# Patient Record
Sex: Female | Born: 1980
Health system: Southern US, Community
[De-identification: ages and names within clinical notes are randomized; demographics above are authoritative.]

## PROBLEM LIST (undated history)

## (undated) DIAGNOSIS — Z803 Family history of malignant neoplasm of breast: Secondary | ICD-10-CM

## (undated) DIAGNOSIS — Z800 Family history of malignant neoplasm of digestive organs: Secondary | ICD-10-CM

## (undated) DIAGNOSIS — Z83719 Family history of colon polyps, unspecified: Secondary | ICD-10-CM

## (undated) DIAGNOSIS — G43909 Migraine, unspecified, not intractable, without status migrainosus: Secondary | ICD-10-CM

## (undated) DIAGNOSIS — Z8371 Family history of colonic polyps: Secondary | ICD-10-CM

## (undated) DIAGNOSIS — Z8481 Family history of carrier of genetic disease: Secondary | ICD-10-CM

## (undated) DIAGNOSIS — IMO0002 Reserved for concepts with insufficient information to code with codable children: Secondary | ICD-10-CM

## (undated) HISTORY — DX: Family history of colonic polyps: Z83.71

## (undated) HISTORY — DX: Family history of colon polyps, unspecified: Z83.719

## (undated) HISTORY — DX: Migraine, unspecified, not intractable, without status migrainosus: G43.909

## (undated) HISTORY — DX: Family history of malignant neoplasm of breast: Z80.3

## (undated) HISTORY — DX: Reserved for concepts with insufficient information to code with codable children: IMO0002

## (undated) HISTORY — DX: Family history of malignant neoplasm of digestive organs: Z80.0

## (undated) HISTORY — DX: Family history of carrier of genetic disease: Z84.81

---

## 1999-05-04 DIAGNOSIS — IMO0002 Reserved for concepts with insufficient information to code with codable children: Secondary | ICD-10-CM

## 1999-05-04 HISTORY — DX: Reserved for concepts with insufficient information to code with codable children: IMO0002

## 2006-04-20 ENCOUNTER — Ambulatory Visit: Payer: Self-pay | Admitting: Obstetrics and Gynecology

## 2006-08-11 ENCOUNTER — Inpatient Hospital Stay: Payer: Self-pay | Admitting: Certified Nurse Midwife

## 2007-04-14 ENCOUNTER — Ambulatory Visit: Payer: Self-pay

## 2007-04-16 ENCOUNTER — Ambulatory Visit: Payer: Self-pay | Admitting: Obstetrics and Gynecology

## 2007-04-19 ENCOUNTER — Ambulatory Visit: Payer: Self-pay | Admitting: Obstetrics and Gynecology

## 2007-04-21 ENCOUNTER — Ambulatory Visit: Payer: Self-pay | Admitting: Obstetrics and Gynecology

## 2007-05-02 ENCOUNTER — Ambulatory Visit: Payer: Self-pay | Admitting: Obstetrics and Gynecology

## 2007-12-15 ENCOUNTER — Inpatient Hospital Stay: Payer: Self-pay | Admitting: Obstetrics and Gynecology

## 2010-08-20 ENCOUNTER — Ambulatory Visit: Payer: Self-pay | Admitting: Internal Medicine

## 2010-12-23 ENCOUNTER — Ambulatory Visit: Payer: Self-pay | Admitting: General Practice

## 2012-01-24 LAB — HM PAP SMEAR: HM Pap smear: NORMAL

## 2012-07-01 HISTORY — PX: CYST REMOVAL TRUNK: SHX6283

## 2012-09-22 ENCOUNTER — Encounter: Payer: Self-pay | Admitting: Internal Medicine

## 2012-09-22 ENCOUNTER — Ambulatory Visit (INDEPENDENT_AMBULATORY_CARE_PROVIDER_SITE_OTHER): Payer: 59 | Admitting: Internal Medicine

## 2012-09-22 VITALS — BP 112/60 | HR 77 | Temp 98.2°F | Ht 68.25 in | Wt 256.0 lb

## 2012-09-22 DIAGNOSIS — Z0000 Encounter for general adult medical examination without abnormal findings: Secondary | ICD-10-CM

## 2012-09-22 DIAGNOSIS — T783XXA Angioneurotic edema, initial encounter: Secondary | ICD-10-CM

## 2012-09-22 DIAGNOSIS — G43909 Migraine, unspecified, not intractable, without status migrainosus: Secondary | ICD-10-CM | POA: Insufficient documentation

## 2012-09-22 MED ORDER — EPINEPHRINE 0.3 MG/0.3ML IJ SOAJ: 0.3000 mg | Freq: Once | INTRAMUSCULAR | Status: DC

## 2012-09-22 MED ORDER — SUMATRIPTAN SUCCINATE 100 MG PO TABS: 100.0000 mg | ORAL_TABLET | ORAL | Status: DC | PRN

## 2012-09-22 NOTE — Progress Notes (Signed)
Subjective:    Patient ID: Kara Vaughn, female    DOB: [birth year], 32 y.o.   MRN: [id number]  HPI 32 year old female presents to establish care. She reports a history of migraine headaches which are well controlled with intermittent use of Imitrex. Headaches are described as aching associated with photo and phonophobia. She only rarely uses medication for headache pain.  She is concerned today about nearly one-year history of intermittent episodes of swelling in her upper lip. She reports this occurs about once per month. She develops swelling in her right upper lip and right upper comes. She has not been able to identify a trigger. She has not had any trouble breathing or trouble swallowing. She has no previous history of allergies. She does not develop hives with these episodes. She denies use of any new medications or supplements. She denies any family history of angioedema. She brings a photo of herself during one of these episodes today which shows marked swelling of the right upper lip. In the past, a provider prescribed her with prednisone with some improvement in her symptoms. However, generally the swelling resolves on its own after a few days without intervention.  Outpatient Encounter Prescriptions as of 09/22/2012  Medication Sig Dispense Refill  . EPINEPHrine (EPIPEN) 0.3 mg/0.3 mL DEVI Inject 0.3 mLs (0.3 mg total) into the muscle once.  2 Device  1  . SUMAtriptan (IMITREX) 100 MG tablet Take 1 tablet (100 mg total) by mouth every 2 (two) hours as needed for migraine.  10 tablet  0   No facility-administered encounter medications on file as of 09/22/2012.   BP 112/60  Pulse 77  Temp(Src) 98.2 F (36.8 C) (Oral)  Ht 5' 8.25" (1.734 m)  Wt 256 lb (116.121 kg)  BMI 38.62 kg/m2  SpO2 98%  LMP 09/22/2012  Review of Systems  Constitutional: Negative for fever, chills, appetite change, fatigue and unexpected weight change.  HENT: Negative for ear pain, congestion, sore throat,  trouble swallowing, neck pain, voice change and sinus pressure.   Eyes: Negative for visual disturbance.  Respiratory: Negative for cough, shortness of breath, wheezing and stridor.   Cardiovascular: Negative for chest pain, palpitations and leg swelling.  Gastrointestinal: Negative for nausea, vomiting, abdominal pain, diarrhea, constipation, blood in stool, abdominal distention and anal bleeding.  Genitourinary: Negative for dysuria and flank pain.  Musculoskeletal: Negative for myalgias, arthralgias and gait problem.  Skin: Negative for color change and rash.  Neurological: Negative for dizziness and headaches.  Hematological: Negative for adenopathy. Does not bruise/bleed easily.  Psychiatric/Behavioral: Negative for suicidal ideas, sleep disturbance and dysphoric mood. The patient is not nervous/anxious.        Objective:   Physical Exam  Constitutional: She is oriented to person, place, and time. She appears well-developed and well-nourished. No distress.  HENT:  Head: Normocephalic and atraumatic.  Right Ear: External ear normal.  Left Ear: External ear normal.  Nose: Nose normal.  Mouth/Throat: Oropharynx is clear and moist. No oropharyngeal exudate.  Eyes: Conjunctivae are normal. Pupils are equal, round, and reactive to light. Right eye exhibits no discharge. Left eye exhibits no discharge. No scleral icterus.  Neck: Normal range of motion. Neck supple. No tracheal deviation present. No thyromegaly present.  Cardiovascular: Normal rate, regular rhythm, normal heart sounds and intact distal pulses.  Exam reveals no gallop and no friction rub.   No murmur heard. Pulmonary/Chest: Effort normal and breath sounds normal. No accessory muscle usage. Not tachypneic. No respiratory distress. She has no decreased  breath sounds. She has no wheezes. She has no rhonchi. She has no rales. She exhibits no tenderness.  Abdominal: Soft. Bowel sounds are normal. She exhibits no distension and no  mass. There is no tenderness. There is no rebound and no guarding.  Musculoskeletal: Normal range of motion. She exhibits no edema and no tenderness.  Lymphadenopathy:    She has no cervical adenopathy.  Neurological: She is alert and oriented to person, place, and time. No cranial nerve deficit. She exhibits normal muscle tone. Coordination normal.  Skin: Skin is warm and dry. No rash noted. She is not diaphoretic. No erythema. No pallor.  Psychiatric: She has a normal mood and affect. Her behavior is normal. Judgment and thought content normal.          Assessment & Plan:

## 2012-09-22 NOTE — Assessment & Plan Note (Signed)
Symptoms well controlled with intermittent use of Imitrex. Will continue.

## 2012-09-22 NOTE — Assessment & Plan Note (Signed)
Symptoms are most consistent with angioedema. Will set up referral to allergy immunology for further evaluation. In the interim, given that we do not know the trigger for her symptoms, will prescribe EpiPen for her to use if symptoms are progressive and associated with shortness of breath.

## 2012-09-28 ENCOUNTER — Other Ambulatory Visit (INDEPENDENT_AMBULATORY_CARE_PROVIDER_SITE_OTHER): Payer: 59

## 2012-09-28 ENCOUNTER — Telehealth: Payer: Self-pay | Admitting: Internal Medicine

## 2012-09-28 DIAGNOSIS — Z0000 Encounter for general adult medical examination without abnormal findings: Secondary | ICD-10-CM

## 2012-09-28 LAB — COMPREHENSIVE METABOLIC PANEL
ALT: 17 U/L (ref 0–35)
AST: 17 U/L (ref 0–37)
Albumin: 4.1 g/dL (ref 3.5–5.2)
Calcium: 8.6 mg/dL (ref 8.4–10.5)
Chloride: 103 mEq/L (ref 96–112)
Creatinine, Ser: 0.7 mg/dL (ref 0.4–1.2)
Potassium: 4 mEq/L (ref 3.5–5.1)

## 2012-09-28 LAB — CBC WITH DIFFERENTIAL/PLATELET
Eosinophils Relative: 2.4 % (ref 0.0–5.0)
HCT: 35 % — ABNORMAL LOW (ref 36.0–46.0)
Lymphocytes Relative: 22.1 % (ref 12.0–46.0)
Lymphs Abs: 1.8 10*3/uL (ref 0.7–4.0)
Monocytes Relative: 7.8 % (ref 3.0–12.0)
Platelets: 275 10*3/uL (ref 150.0–400.0)
WBC: 8 10*3/uL (ref 4.5–10.5)

## 2012-09-28 LAB — LIPID PANEL
HDL: 42.5 mg/dL (ref >39.00)
Total CHOL/HDL Ratio: 3

## 2012-09-28 NOTE — Telephone Encounter (Signed)
Yes, that will be fine.

## 2012-09-28 NOTE — Telephone Encounter (Signed)
Patient informed and appointment cancelled

## 2012-09-28 NOTE — Telephone Encounter (Signed)
Pt had to move her allergy test from 6/27 to 7/11 and she has a follow up appointment 10/31/12.  Do you want her to r/s her follow up after her allergy testing on 7/11

## 2012-10-31 ENCOUNTER — Ambulatory Visit (INDEPENDENT_AMBULATORY_CARE_PROVIDER_SITE_OTHER): Payer: 59 | Admitting: Internal Medicine

## 2012-10-31 ENCOUNTER — Encounter: Payer: Self-pay | Admitting: Internal Medicine

## 2012-10-31 VITALS — BP 118/80 | HR 72 | Temp 98.3°F | Wt 261.0 lb

## 2012-10-31 DIAGNOSIS — D509 Iron deficiency anemia, unspecified: Secondary | ICD-10-CM

## 2012-10-31 DIAGNOSIS — T7589XS Other specified effects of external causes, sequela: Secondary | ICD-10-CM

## 2012-10-31 DIAGNOSIS — T783XXS Angioneurotic edema, sequela: Secondary | ICD-10-CM

## 2012-10-31 DIAGNOSIS — T788XXS Other adverse effects, not elsewhere classified, sequela: Secondary | ICD-10-CM

## 2012-10-31 LAB — CBC WITH DIFFERENTIAL/PLATELET
Basophils Absolute: 0 10*3/uL (ref 0.0–0.1)
Basophils Relative: 0.4 % (ref 0.0–3.0)
Eosinophils Absolute: 0.2 10*3/uL (ref 0.0–0.7)
Lymphocytes Relative: 22.4 % (ref 12.0–46.0)
MCHC: 32.4 g/dL (ref 30.0–36.0)
MCV: 77.7 fl — ABNORMAL LOW (ref 78.0–100.0)
Monocytes Absolute: 0.5 10*3/uL (ref 0.1–1.0)
Neutro Abs: 5 10*3/uL (ref 1.4–7.7)
Neutrophils Relative %: 68.3 % (ref 43.0–77.0)
RDW: 21.7 % — ABNORMAL HIGH (ref 11.5–14.6)

## 2012-10-31 NOTE — Assessment & Plan Note (Signed)
No recurrent symptoms since last visit. Referral to allergy/immunology pending for 11/10/2012. Will follow.

## 2012-10-31 NOTE — Progress Notes (Signed)
Subjective:    Patient ID: Kara Vaughn, female    DOB: Oct 27, 1980, 32 y.o.   MRN: [id number]  HPI 31YO female with h/o angioedema, migraines and recent diagnosis iron def anemia presents for follow up. Doing well. Notes some nausea and constipation with iron supplement. Taking about 1-2 per day. Menses have sig decreased in flow with use of Mirena. No recurrent episodes of angioedema.  Outpatient Encounter Prescriptions as of 10/31/2012  Medication Sig Dispense Refill  . EPINEPHrine (EPIPEN) 0.3 mg/0.3 mL DEVI Inject 0.3 mLs (0.3 mg total) into the muscle once.  2 Device  1  . ferrous fumarate (HEMOCYTE - 106 MG FE) 325 (106 FE) MG TABS Take 1 tablet by mouth.      . Multiple Vitamin (MULTIVITAMIN) tablet Take 1 tablet by mouth daily.      . SUMAtriptan (IMITREX) 100 MG tablet Take 1 tablet (100 mg total) by mouth every 2 (two) hours as needed for migraine.  10 tablet  0   No facility-administered encounter medications on file as of 10/31/2012.   BP 118/80  Pulse 72  Temp(Src) 98.3 F (36.8 C) (Oral)  Wt 261 lb (118.389 kg)  BMI 39.37 kg/m2  SpO2 98%  Review of Systems  Constitutional: Negative for fever, chills, appetite change, fatigue and unexpected weight change.  HENT: Negative for ear pain, congestion, sore throat, trouble swallowing, neck pain, voice change and sinus pressure.   Eyes: Negative for visual disturbance.  Respiratory: Negative for cough, shortness of breath, wheezing and stridor.   Cardiovascular: Negative for chest pain, palpitations and leg swelling.  Gastrointestinal: Negative for nausea, vomiting, abdominal pain, diarrhea, constipation, blood in stool, abdominal distention and anal bleeding.  Genitourinary: Negative for dysuria and flank pain.  Musculoskeletal: Negative for myalgias, arthralgias and gait problem.  Skin: Negative for color change and rash.  Neurological: Negative for dizziness and headaches.  Hematological: Negative for adenopathy. Does not  bruise/bleed easily.  Psychiatric/Behavioral: Negative for suicidal ideas, sleep disturbance and dysphoric mood. The patient is not nervous/anxious.        Objective:   Physical Exam  Constitutional: She is oriented to person, place, and time. She appears well-developed and well-nourished. No distress.  HENT:  Head: Normocephalic and atraumatic.  Right Ear: External ear normal.  Left Ear: External ear normal.  Nose: Nose normal.  Mouth/Throat: Oropharynx is clear and moist. No oropharyngeal exudate.  Eyes: Conjunctivae are normal. Pupils are equal, round, and reactive to light. Right eye exhibits no discharge. Left eye exhibits no discharge. No scleral icterus.  Neck: Normal range of motion. Neck supple. No tracheal deviation present. No thyromegaly present.  Cardiovascular: Normal rate, regular rhythm, normal heart sounds and intact distal pulses.  Exam reveals no gallop and no friction rub.   No murmur heard. Pulmonary/Chest: Effort normal and breath sounds normal. No accessory muscle usage. Not tachypneic. No respiratory distress. She has no decreased breath sounds. She has no wheezes. She has no rhonchi. She has no rales. She exhibits no tenderness.  Musculoskeletal: Normal range of motion. She exhibits no edema and no tenderness.  Lymphadenopathy:    She has no cervical adenopathy.  Neurological: She is alert and oriented to person, place, and time. No cranial nerve deficit. She exhibits normal muscle tone. Coordination normal.  Skin: Skin is warm and dry. No rash noted. She is not diaphoretic. No erythema. No pallor.  Psychiatric: She has a normal mood and affect. Her behavior is normal. Judgment and thought content normal.  Assessment & Plan:

## 2012-10-31 NOTE — Assessment & Plan Note (Signed)
Microcytic anemia noted on recent labs. C/w iron def anemia secondary to menorrhagia. Menses have improved with Mirena. Pt has completed 1 month of iron supplementation. Having some issues with nausea and constipation on iron supplement. Will recheck CBC and ferritin today to see if any improvement.

## 2012-11-30 ENCOUNTER — Encounter: Payer: Self-pay | Admitting: Internal Medicine

## 2013-02-07 LAB — HM PAP SMEAR: HM Pap smear: NORMAL

## 2013-03-08 ENCOUNTER — Other Ambulatory Visit: Payer: Self-pay

## 2014-02-07 ENCOUNTER — Encounter: Payer: Self-pay | Admitting: Internal Medicine

## 2014-02-07 ENCOUNTER — Ambulatory Visit (INDEPENDENT_AMBULATORY_CARE_PROVIDER_SITE_OTHER): Payer: 59 | Admitting: Internal Medicine

## 2014-02-07 VITALS — BP 110/68 | HR 65 | Temp 98.2°F | Ht 68.0 in | Wt 273.5 lb

## 2014-02-07 DIAGNOSIS — Z0000 Encounter for general adult medical examination without abnormal findings: Secondary | ICD-10-CM | POA: Insufficient documentation

## 2014-02-07 DIAGNOSIS — T783XXA Angioneurotic edema, initial encounter: Secondary | ICD-10-CM

## 2014-02-07 LAB — COMPREHENSIVE METABOLIC PANEL
ALK PHOS: 51 U/L (ref 39–117)
ALT: 24 U/L (ref 0–35)
AST: 22 U/L (ref 0–37)
Albumin: 3.8 g/dL (ref 3.5–5.2)
BUN: 13 mg/dL (ref 6–23)
CO2: 28 meq/L (ref 19–32)
CREATININE: 0.6 mg/dL (ref 0.4–1.2)
Calcium: 8.8 mg/dL (ref 8.4–10.5)
Chloride: 102 mEq/L (ref 96–112)
GFR: 112.82 mL/min (ref >60.00)
GLUCOSE: 88 mg/dL (ref 70–99)
Potassium: 3.9 mEq/L (ref 3.5–5.1)
SODIUM: 136 meq/L (ref 135–145)
Total Bilirubin: 0.8 mg/dL (ref 0.2–1.2)
Total Protein: 7.2 g/dL (ref 6.0–8.3)

## 2014-02-07 LAB — CBC WITH DIFFERENTIAL/PLATELET
Basophils Absolute: 0 10*3/uL (ref 0.0–0.1)
Basophils Relative: 0.4 % (ref 0.0–3.0)
EOS PCT: 3 % (ref 0.0–5.0)
Eosinophils Absolute: 0.2 10*3/uL (ref 0.0–0.7)
HCT: 38.5 % (ref 36.0–46.0)
Hemoglobin: 12.7 g/dL (ref 12.0–15.0)
LYMPHS ABS: 2.1 10*3/uL (ref 0.7–4.0)
Lymphocytes Relative: 26.9 % (ref 12.0–46.0)
MCHC: 33 g/dL (ref 30.0–36.0)
MCV: 84.6 fl (ref 78.0–100.0)
MONO ABS: 0.6 10*3/uL (ref 0.1–1.0)
MONOS PCT: 7.8 % (ref 3.0–12.0)
Neutro Abs: 4.9 10*3/uL (ref 1.4–7.7)
Neutrophils Relative %: 61.9 % (ref 43.0–77.0)
PLATELETS: 240 10*3/uL (ref 150.0–400.0)
RBC: 4.55 Mil/uL (ref 3.87–5.11)
RDW: 14.5 % (ref 11.5–15.5)
WBC: 7.9 10*3/uL (ref 4.0–10.5)

## 2014-02-07 LAB — LIPID PANEL
CHOLESTEROL: 151 mg/dL (ref 0–200)
HDL: 40.9 mg/dL (ref >39.00)
LDL CALC: 93 mg/dL (ref 0–99)
NonHDL: 110.1
TRIGLYCERIDES: 84 mg/dL (ref 0.0–149.0)
Total CHOL/HDL Ratio: 4
VLDL: 16.8 mg/dL (ref 0.0–40.0)

## 2014-02-07 LAB — T4, FREE: Free T4: 0.85 ng/dL (ref 0.60–1.60)

## 2014-02-07 LAB — TSH: TSH: 1.72 u[IU]/mL (ref 0.35–4.50)

## 2014-02-07 LAB — HEMOGLOBIN A1C: HEMOGLOBIN A1C: 6.1 % (ref 4.6–6.5)

## 2014-02-07 LAB — VITAMIN D 25 HYDROXY (VIT D DEFICIENCY, FRACTURES): VITD: 27.39 ng/mL — ABNORMAL LOW (ref 30.00–100.00)

## 2014-02-07 LAB — MICROALBUMIN / CREATININE URINE RATIO
Creatinine,U: 79.7 mg/dL
MICROALB/CREAT RATIO: 0.8 mg/g (ref 0.0–30.0)
Microalb, Ur: 0.6 mg/dL (ref 0.0–1.9)

## 2014-02-07 MED ORDER — EPINEPHRINE 0.3 MG/0.3ML IJ SOAJ: 0.3000 mg | Freq: Once | INTRAMUSCULAR | Status: DC

## 2014-02-07 NOTE — Assessment & Plan Note (Signed)
Wt Readings from Last 3 Encounters:  02/07/14 273 lb 8 oz (124.059 kg)  10/31/12 261 lb (118.389 kg)  09/22/12 256 lb (116.121 kg)   Body mass index is 41.6 kg/(m^2). Encouraged healthy diet and limited calorie intake as well as regular physical activity, with goal of weight loss.

## 2014-02-07 NOTE — Progress Notes (Signed)
Pre visit review using our clinic review tool, if applicable. No additional management support is needed unless otherwise documented below in the visit note. 

## 2014-02-07 NOTE — Patient Instructions (Signed)

## 2014-02-07 NOTE — Assessment & Plan Note (Signed)
General medical exam normal today. PAP, pelvic and breast exam deferred,as completed by OB. Flu vaccine UTD. Labs today including CBC, CMP, lipids, TSH, A1c.

## 2014-02-07 NOTE — Progress Notes (Signed)
Subjective:    Patient ID: Milda Lindvall, female    DOB: [birth year], 33 y.o.   MRN: [id number]  HPI 33YO female presents for physical exam. Feeling well. No concerns today. Has exam scheduled with OB this month for PAP and breast exam. Trying to improve diet and increase physical activity with goal of weight loss.  Review of Systems  Constitutional: Negative for fever, chills, appetite change, fatigue and unexpected weight change.  Eyes: Negative for visual disturbance.  Respiratory: Negative for shortness of breath.   Cardiovascular: Negative for chest pain and leg swelling.  Gastrointestinal: Negative for nausea, vomiting, abdominal pain, diarrhea, constipation and blood in stool.  Genitourinary: Negative for menstrual problem.  Musculoskeletal: Positive for myalgias (occasional mild pain right lateral ankle, ongoing for years). Negative for arthralgias.  Skin: Negative for color change and rash.  Hematological: Negative for adenopathy. Does not bruise/bleed easily.  Psychiatric/Behavioral: Negative for sleep disturbance and dysphoric mood. The patient is not nervous/anxious.        Objective:    BP 110/68  Pulse 65  Temp(Src) 98.2 F (36.8 C) (Oral)  Ht 5\' 8"  (1.727 m)  Wt 273 lb 8 oz (124.059 kg)  BMI 41.60 kg/m2  SpO2 97%  LMP 01/03/2014 Physical Exam  Constitutional: She is oriented to person, place, and time. She appears well-developed and well-nourished. No distress.  HENT:  Head: Normocephalic and atraumatic.  Right Ear: External ear normal.  Left Ear: External ear normal.  Nose: Nose normal.  Mouth/Throat: Oropharynx is clear and moist. No oropharyngeal exudate.  Eyes: Conjunctivae and EOM are normal. Pupils are equal, round, and reactive to light. Right eye exhibits no discharge. Left eye exhibits no discharge. No scleral icterus.  Neck: Normal range of motion. Neck supple. No tracheal deviation present. No thyromegaly present.  Cardiovascular: Normal rate,  regular rhythm, normal heart sounds and intact distal pulses.  Exam reveals no gallop and no friction rub.   No murmur heard. Pulmonary/Chest: Effort normal and breath sounds normal. No accessory muscle usage. Not tachypneic. No respiratory distress. She has no decreased breath sounds. She has no wheezes. She has no rhonchi. She has no rales. She exhibits no tenderness.  Abdominal: Soft. Bowel sounds are normal. She exhibits no distension and no mass. There is no tenderness. There is no rebound and no guarding.  Musculoskeletal: Normal range of motion. She exhibits no edema and no tenderness.  Lymphadenopathy:    She has no cervical adenopathy.  Neurological: She is alert and oriented to person, place, and time. No cranial nerve deficit. She exhibits normal muscle tone. Coordination normal.  Skin: Skin is warm and dry. No rash noted. She is not diaphoretic. No erythema. No pallor.  Psychiatric: She has a normal mood and affect. Her behavior is normal. Judgment and thought content normal.          Assessment & Plan:   Problem List Items Addressed This Visit     Unprioritized   Angioedema of lips   Relevant Medications      EPINEPHrine 0.3 mg/0.3 mL IJ SOAJ injection   Routine general medical examination at a health care facility - Primary     General medical exam normal today. PAP, pelvic and breast exam deferred,as completed by OB. Flu vaccine UTD. Labs today including CBC, CMP, lipids, TSH, A1c.     Relevant Orders      T4, free      TSH      CBC with Differential  Comprehensive metabolic panel      Lipid panel      Vit D  25 hydroxy (rtn osteoporosis monitoring)      Microalbumin / creatinine urine ratio      Hemoglobin A1c   Severe obesity (BMI >= 40)      Wt Readings from Last 3 Encounters:  02/07/14 273 lb 8 oz (124.059 kg)  10/31/12 261 lb (118.389 kg)  09/22/12 256 lb (116.121 kg)   Body mass index is 41.6 kg/(m^2). Encouraged healthy diet and limited calorie  intake as well as regular physical activity, with goal of weight loss.        Return in about 1 year (around 02/08/2015) for Physical.

## 2014-03-30 LAB — HM PAP SMEAR: HM Pap smear: NORMAL

## 2014-05-23 ENCOUNTER — Encounter: Payer: Self-pay | Admitting: Internal Medicine

## 2014-06-18 ENCOUNTER — Other Ambulatory Visit: Payer: Self-pay | Admitting: Internal Medicine

## 2014-06-18 DIAGNOSIS — G43909 Migraine, unspecified, not intractable, without status migrainosus: Secondary | ICD-10-CM

## 2014-06-19 MED ORDER — SUMATRIPTAN SUCCINATE 100 MG PO TABS: 100.0000 mg | ORAL_TABLET | ORAL | Status: DC | PRN

## 2015-03-21 ENCOUNTER — Other Ambulatory Visit (HOSPITAL_COMMUNITY): Admission: RE | Admit: 2015-03-21 | Payer: 59 | Source: Ambulatory Visit | Admitting: Internal Medicine

## 2015-03-21 ENCOUNTER — Other Ambulatory Visit (HOSPITAL_COMMUNITY)
Admission: RE | Admit: 2015-03-21 | Discharge: 2015-03-21 | Disposition: A | Discharge status: Home / Self Care | Payer: 59 | Source: Ambulatory Visit | Attending: Internal Medicine | Admitting: Internal Medicine

## 2015-03-21 ENCOUNTER — Ambulatory Visit (INDEPENDENT_AMBULATORY_CARE_PROVIDER_SITE_OTHER): Payer: 59 | Admitting: Internal Medicine

## 2015-03-21 ENCOUNTER — Encounter: Payer: Self-pay | Admitting: Internal Medicine

## 2015-03-21 VITALS — BP 104/71 | HR 58 | Temp 98.9°F | Ht 68.5 in | Wt 232.0 lb

## 2015-03-21 DIAGNOSIS — Z139 Encounter for screening, unspecified: Secondary | ICD-10-CM | POA: Diagnosis present

## 2015-03-21 DIAGNOSIS — Z0000 Encounter for general adult medical examination without abnormal findings: Secondary | ICD-10-CM

## 2015-03-21 LAB — COMPREHENSIVE METABOLIC PANEL
ALT: 11 U/L (ref 0–35)
AST: 16 U/L (ref 0–37)
Albumin: 4.6 g/dL (ref 3.5–5.2)
Alkaline Phosphatase: 50 U/L (ref 39–117)
BILIRUBIN TOTAL: 1.2 mg/dL (ref 0.2–1.2)
BUN: 10 mg/dL (ref 6–23)
CO2: 29 meq/L (ref 19–32)
Calcium: 9.5 mg/dL (ref 8.4–10.5)
Chloride: 101 mEq/L (ref 96–112)
Creatinine, Ser: 0.69 mg/dL (ref 0.40–1.20)
GFR: 102.77 mL/min (ref >60.00)
GLUCOSE: 89 mg/dL (ref 70–99)
POTASSIUM: 4.1 meq/L (ref 3.5–5.1)
SODIUM: 138 meq/L (ref 135–145)
TOTAL PROTEIN: 7.1 g/dL (ref 6.0–8.3)

## 2015-03-21 LAB — CBC WITH DIFFERENTIAL/PLATELET
BASOS ABS: 0 10*3/uL (ref 0.0–0.1)
Basophils Relative: 0.8 % (ref 0.0–3.0)
Eosinophils Absolute: 0.1 10*3/uL (ref 0.0–0.7)
Eosinophils Relative: 2.3 % (ref 0.0–5.0)
HCT: 43.6 % (ref 36.0–46.0)
Hemoglobin: 14.6 g/dL (ref 12.0–15.0)
LYMPHS ABS: 1.7 10*3/uL (ref 0.7–4.0)
Lymphocytes Relative: 28.7 % (ref 12.0–46.0)
MCHC: 33.4 g/dL (ref 30.0–36.0)
MCV: 88.2 fl (ref 78.0–100.0)
MONO ABS: 0.4 10*3/uL (ref 0.1–1.0)
MONOS PCT: 6.8 % (ref 3.0–12.0)
NEUTROS ABS: 3.6 10*3/uL (ref 1.4–7.7)
NEUTROS PCT: 61.4 % (ref 43.0–77.0)
PLATELETS: 244 10*3/uL (ref 150.0–400.0)
RBC: 4.95 Mil/uL (ref 3.87–5.11)
RDW: 13.9 % (ref 11.5–15.5)
WBC: 5.9 10*3/uL (ref 4.0–10.5)

## 2015-03-21 LAB — LIPID PANEL
CHOL/HDL RATIO: 2
Cholesterol: 124 mg/dL (ref 0–200)
HDL: 52.2 mg/dL (ref >39.00)
LDL Cholesterol: 62 mg/dL (ref 0–99)
NONHDL: 71.38
Triglycerides: 48 mg/dL (ref 0.0–149.0)
VLDL: 9.6 mg/dL (ref 0.0–40.0)

## 2015-03-21 LAB — TSH: TSH: 0.91 u[IU]/mL (ref 0.35–4.50)

## 2015-03-21 LAB — VITAMIN D 25 HYDROXY (VIT D DEFICIENCY, FRACTURES): VITD: 26.3 ng/mL — ABNORMAL LOW (ref 30.00–100.00)

## 2015-03-21 LAB — HEMOGLOBIN A1C: HEMOGLOBIN A1C: 5.7 % (ref 4.6–6.5)

## 2015-03-21 NOTE — Progress Notes (Signed)
Pre visit review using our clinic review tool, if applicable. No additional management support is needed unless otherwise documented below in the visit note. 

## 2015-03-21 NOTE — Addendum Note (Signed)
Addended by: Karlene Einstein D on: 03/21/2015 11:12 AM   Modules accepted: Orders

## 2015-03-21 NOTE — Progress Notes (Signed)
Subjective:    Patient ID: Kara Vaughn, female    DOB: 1980-11-12, 34 y.o.   MRN: [id number]  HPI  34YO female presents for physical exam.  Feeling well. Trying to be more active. Walking more. Limiting intake of sweetened beverages.  Limiting overall calories.  Wt Readings from Last 3 Encounters:  03/21/15 232 lb (105.235 kg)  02/07/14 273 lb 8 oz (124.059 kg)  10/31/12 261 lb (118.389 kg)   BP Readings from Last 3 Encounters:  03/21/15 104/71  02/07/14 110/68  10/31/12 118/80    Past Medical History  Diagnosis Date  . Migraine   . HPV test positive 2001   Family History  Problem Relation Age of Onset  . Thyroid disease Father   . Heart disease Father     2011, MI  . Thyroid disease Sister   . Cancer Maternal Aunt     ?colon cancer  . Cancer Paternal Aunt     esophageal  . COPD Maternal Grandfather   . Cancer Paternal Grandmother     ?uterine cancer, breast cancer, bone cancer   Past Surgical History  Procedure Laterality Date  . Cyst removal trunk  07/2012    [facility]  . Vaginal delivery      3   Social History   Social History  . Marital Status: Married    Spouse Name: N/A  . Number of Children: N/A  . Years of Education: N/A   Social History Main Topics  . Smoking status: Never Smoker   . Smokeless tobacco: Never Used  . Alcohol Use: Yes     Comment: Rarely  . Drug Use: No  . Sexual Activity: Not Asked   Other Topics Concern  . None   Social History Narrative   Lives in [place], [place]. 3 children 47, 47, and 14 years old. Fluent in [place].      Work - [employer] in [employer], sports, [employer]             Review of Systems  Constitutional: Negative for fever, chills, appetite change, fatigue and unexpected weight change.  Eyes: Negative for visual disturbance.  Respiratory: Negative for shortness of breath and wheezing.   Cardiovascular: Negative for chest pain and leg swelling.  Gastrointestinal: Negative for nausea, vomiting,  abdominal pain, diarrhea and constipation.  Musculoskeletal: Negative for myalgias and arthralgias.  Skin: Negative for color change and rash.  Hematological: Negative for adenopathy. Does not bruise/bleed easily.  Psychiatric/Behavioral: Negative for suicidal ideas, sleep disturbance and dysphoric mood. The patient is not nervous/anxious.        Objective:    BP 104/71 mmHg  Pulse 58  Temp(Src) 98.9 F (37.2 C) (Oral)  Ht 5' 8.5" (1.74 m)  Wt 232 lb (105.235 kg)  BMI 34.76 kg/m2  SpO2 99%  LMP 03/16/2015 Physical Exam  Constitutional: She is oriented to person, place, and time. She appears well-developed and well-nourished. No distress.  HENT:  Head: Normocephalic and atraumatic.  Right Ear: External ear normal.  Left Ear: External ear normal.  Nose: Nose normal.  Mouth/Throat: Oropharynx is clear and moist. No oropharyngeal exudate.  Eyes: Conjunctivae are normal. Pupils are equal, round, and reactive to light. Right eye exhibits no discharge. Left eye exhibits no discharge. No scleral icterus.  Neck: Normal range of motion. Neck supple. No tracheal deviation present. No thyromegaly present.  Cardiovascular: Normal rate, regular rhythm, normal heart sounds and intact distal pulses.  Exam reveals no gallop and no friction rub.  No murmur heard. Pulmonary/Chest: Effort normal and breath sounds normal. No respiratory distress. She has no wheezes. She has no rales. She exhibits no tenderness.  Abdominal: Soft. Bowel sounds are normal. She exhibits no distension and no mass. There is no tenderness. There is no rebound and no guarding.  Genitourinary: Rectum normal, vagina normal and uterus normal. No breast swelling, tenderness, discharge or bleeding. Pelvic exam was performed with patient supine. There is no rash, tenderness or lesion on the right labia. There is no rash, tenderness or lesion on the left labia. Uterus is not enlarged and not tender. Cervix exhibits no motion  tenderness, no discharge and no friability. Right adnexum displays no mass, no tenderness and no fullness. Left adnexum displays no mass, no tenderness and no fullness. No erythema or tenderness in the vagina. No vaginal discharge found.  Musculoskeletal: Normal range of motion. She exhibits no edema or tenderness.  Lymphadenopathy:    She has no cervical adenopathy.  Neurological: She is alert and oriented to person, place, and time. No cranial nerve deficit. She exhibits normal muscle tone. Coordination normal.  Skin: Skin is warm and dry. No rash noted. She is not diaphoretic. No erythema. No pallor.  Psychiatric: She has a normal mood and affect. Her behavior is normal. Judgment and thought content normal.          Assessment & Plan:   Problem List Items Addressed This Visit      Unprioritized   Routine general medical examination at a health care facility - Primary   Relevant Orders   TSH   CBC with Differential/Platelet   Comprehensive metabolic panel   Lipid panel   Hemoglobin A1c   VITAMIN D 25 Hydroxy (Vit-D Deficiency, Fractures)       No Follow-up on file.

## 2015-03-21 NOTE — Assessment & Plan Note (Signed)
General medical exam including breast exam and pelvic exam normal today. PAP pending. Encouraged healthy diet and exercise. Immunizations UTD. Labs today.

## 2015-03-21 NOTE — Patient Instructions (Signed)
Health Maintenance, Female Adopting a healthy lifestyle and getting preventive care can go a long way to promote health and wellness. Talk with your health care provider about what schedule of regular examinations is right for you. This is a good chance for you to check in with your provider about disease prevention and staying healthy. In between checkups, there are plenty of things you can do on your own. Experts have done a lot of research about which lifestyle changes and preventive measures are most likely to keep you healthy. Ask your health care provider for more information. WEIGHT AND DIET  Eat a healthy diet  Be sure to include plenty of vegetables, fruits, low-fat dairy products, and lean protein.  Do not eat a lot of foods high in solid fats, added sugars, or salt.  Get regular exercise. This is one of the most important things you can do for your health.  Most adults should exercise for at least 150 minutes each week. The exercise should increase your heart rate and make you sweat (moderate-intensity exercise).  Most adults should also do strengthening exercises at least twice a week. This is in addition to the moderate-intensity exercise.  Maintain a healthy weight  Body mass index (BMI) is a measurement that can be used to identify possible weight problems. It estimates body fat based on height and weight. Your health care provider can help determine your BMI and help you achieve or maintain a healthy weight.  For females 20 years of age and older:   A BMI below 18.5 is considered underweight.  A BMI of 18.5 to 24.9 is normal.  A BMI of 25 to 29.9 is considered overweight.  A BMI of 30 and above is considered obese.  Watch levels of cholesterol and blood lipids  You should start having your blood tested for lipids and cholesterol at 35 years of age, then have this test every 5 years.  You may need to have your cholesterol levels checked more often if:  Your lipid  or cholesterol levels are high.  You are older than 35 years of age.  You are at high risk for heart disease.  CANCER SCREENING   Lung Cancer  Lung cancer screening is recommended for adults 55-80 years old who are at high risk for lung cancer because of a history of smoking.  A yearly low-dose CT scan of the lungs is recommended for people who:  Currently smoke.  Have quit within the past 15 years.  Have at least a 30-pack-year history of smoking. A pack year is smoking an average of one pack of cigarettes a day for 1 year.  Yearly screening should continue until it has been 15 years since you quit.  Yearly screening should stop if you develop a health problem that would prevent you from having lung cancer treatment.  Breast Cancer  Practice breast self-awareness. This means understanding how your breasts normally appear and feel.  It also means doing regular breast self-exams. Let your health care provider know about any changes, no matter how small.  If you are in your 20s or 30s, you should have a clinical breast exam (CBE) by a health care provider every 1-3 years as part of a regular health exam.  If you are 40 or older, have a CBE every year. Also consider having a breast X-ray (mammogram) every year.  If you have a family history of breast cancer, talk to your health care provider about genetic screening.  If you   are at high risk for breast cancer, talk to your health care provider about having an MRI and a mammogram every year.  Breast cancer gene (BRCA) assessment is recommended for women who have family members with BRCA-related cancers. BRCA-related cancers include:  Breast.  Ovarian.  Tubal.  Peritoneal cancers.  Results of the assessment will determine the need for genetic counseling and BRCA1 and BRCA2 testing. Cervical Cancer Your health care provider may recommend that you be screened regularly for cancer of the pelvic organs (ovaries, uterus, and  vagina). This screening involves a pelvic examination, including checking for microscopic changes to the surface of your cervix (Pap test). You may be encouraged to have this screening done every 3 years, beginning at age 21.  For women ages 30-65, health care providers may recommend pelvic exams and Pap testing every 3 years, or they may recommend the Pap and pelvic exam, combined with testing for human papilloma virus (HPV), every 5 years. Some types of HPV increase your risk of cervical cancer. Testing for HPV may also be done on women of any age with unclear Pap test results.  Other health care providers may not recommend any screening for nonpregnant women who are considered low risk for pelvic cancer and who do not have symptoms. Ask your health care provider if a screening pelvic exam is right for you.  If you have had past treatment for cervical cancer or a condition that could lead to cancer, you need Pap tests and screening for cancer for at least 20 years after your treatment. If Pap tests have been discontinued, your risk factors (such as having a new sexual partner) need to be reassessed to determine if screening should resume. Some women have medical problems that increase the chance of getting cervical cancer. In these cases, your health care provider may recommend more frequent screening and Pap tests. Colorectal Cancer  This type of cancer can be detected and often prevented.  Routine colorectal cancer screening usually begins at 35 years of age and continues through 35 years of age.  Your health care provider may recommend screening at an earlier age if you have risk factors for colon cancer.  Your health care provider may also recommend using home test kits to check for hidden blood in the stool.  A small camera at the end of a tube can be used to examine your colon directly (sigmoidoscopy or colonoscopy). This is done to check for the earliest forms of colorectal  cancer.  Routine screening usually begins at age 50.  Direct examination of the colon should be repeated every 5-10 years through 35 years of age. However, you may need to be screened more often if early forms of precancerous polyps or small growths are found. Skin Cancer  Check your skin from head to toe regularly.  Tell your health care provider about any new moles or changes in moles, especially if there is a change in a mole's shape or color.  Also tell your health care provider if you have a mole that is larger than the size of a pencil eraser.  Always use sunscreen. Apply sunscreen liberally and repeatedly throughout the day.  Protect yourself by wearing long sleeves, pants, a wide-brimmed hat, and sunglasses whenever you are outside. HEART DISEASE, DIABETES, AND HIGH BLOOD PRESSURE   High blood pressure causes heart disease and increases the risk of stroke. High blood pressure is more likely to develop in:  People who have blood pressure in the high end   of the normal range (130-139/85-89 mm Hg).  People who are overweight or obese.  People who are African American.  If you are 38-23 years of age, have your blood pressure checked every 3-5 years. If you are 61 years of age or older, have your blood pressure checked every year. You should have your blood pressure measured twice--once when you are at a hospital or clinic, and once when you are not at a hospital or clinic. Record the average of the two measurements. To check your blood pressure when you are not at a hospital or clinic, you can use:  An automated blood pressure machine at a pharmacy.  A home blood pressure monitor.  If you are between 45 years and 39 years old, ask your health care provider if you should take aspirin to prevent strokes.  Have regular diabetes screenings. This involves taking a blood sample to check your fasting blood sugar level.  If you are at a normal weight and have a low risk for diabetes,  have this test once every three years after 35 years of age.  If you are overweight and have a high risk for diabetes, consider being tested at a younger age or more often. PREVENTING INFECTION  Hepatitis B  If you have a higher risk for hepatitis B, you should be screened for this virus. You are considered at high risk for hepatitis B if:  You were born in a country where hepatitis B is common. Ask your health care provider which countries are considered high risk.  Your parents were born in a high-risk country, and you have not been immunized against hepatitis B (hepatitis B vaccine).  You have HIV or AIDS.  You use needles to inject street drugs.  You live with someone who has hepatitis B.  You have had sex with someone who has hepatitis B.  You get hemodialysis treatment.  You take certain medicines for conditions, including cancer, organ transplantation, and autoimmune conditions. Hepatitis C  Blood testing is recommended for:  Everyone born from [birth year] through 1966.  Anyone with known risk factors for hepatitis C. Sexually transmitted infections (STIs)  You should be screened for sexually transmitted infections (STIs) including gonorrhea and chlamydia if:  You are sexually active and are younger than 35 years of age.  You are older than 35 years of age and your health care provider tells you that you are at risk for this type of infection.  Your sexual activity has changed since you were last screened and you are at an increased risk for chlamydia or gonorrhea. Ask your health care provider if you are at risk.  If you do not have HIV, but are at risk, it may be recommended that you take a prescription medicine daily to prevent HIV infection. This is called pre-exposure prophylaxis (PrEP). You are considered at risk if:  You are sexually active and do not regularly use condoms or know the HIV status of your partner(s).  You take drugs by injection.  You are sexually  active with a partner who has HIV. Talk with your health care provider about whether you are at high risk of being infected with HIV. If you choose to begin PrEP, you should first be tested for HIV. You should then be tested every 3 months for as long as you are taking PrEP.  PREGNANCY   If you are premenopausal and you may become pregnant, ask your health care provider about preconception counseling.  If you may  become pregnant, take 400 to 800 micrograms (mcg) of folic acid every day.  If you want to prevent pregnancy, talk to your health care provider about birth control (contraception). OSTEOPOROSIS AND MENOPAUSE   Osteoporosis is a disease in which the bones lose minerals and strength with aging. This can result in serious bone fractures. Your risk for osteoporosis can be identified using a bone density scan.  If you are 61 years of age or older, or if you are at risk for osteoporosis and fractures, ask your health care provider if you should be screened.  Ask your health care provider whether you should take a calcium or vitamin D supplement to lower your risk for osteoporosis.  Menopause may have certain physical symptoms and risks.  Hormone replacement therapy may reduce some of these symptoms and risks. Talk to your health care provider about whether hormone replacement therapy is right for you.  HOME CARE INSTRUCTIONS   Schedule regular health, dental, and eye exams.  Stay current with your immunizations.   Do not use any tobacco products including cigarettes, chewing tobacco, or electronic cigarettes.  If you are pregnant, do not drink alcohol.  If you are breastfeeding, limit how much and how often you drink alcohol.  Limit alcohol intake to no more than 1 drink per day for nonpregnant women. One drink equals 12 ounces of beer, 5 ounces of wine, or 1 ounces of hard liquor.  Do not use street drugs.  Do not share needles.  Ask your health care provider for help if  you need support or information about quitting drugs.  Tell your health care provider if you often feel depressed.  Tell your health care provider if you have ever been abused or do not feel safe at home.   This information is not intended to replace advice given to you by your health care provider. Make sure you discuss any questions you have with your health care provider.   Document Released: 11/02/2010 Document Revised: 05/10/2014 Document Reviewed: 03/21/2013 Elsevier Interactive Patient Education [employer].

## 2015-03-25 LAB — CYTOLOGY - PAP

## 2015-07-06 ENCOUNTER — Other Ambulatory Visit: Payer: Self-pay | Admitting: Internal Medicine

## 2016-06-28 ENCOUNTER — Encounter: Payer: 59 | Admitting: Family

## 2016-07-07 ENCOUNTER — Other Ambulatory Visit (HOSPITAL_COMMUNITY)
Admission: RE | Admit: 2016-07-07 | Discharge: 2016-07-07 | Disposition: A | Discharge status: Home / Self Care | Payer: 59 | Source: Ambulatory Visit | Attending: Family | Admitting: Family

## 2016-07-07 ENCOUNTER — Encounter: Payer: Self-pay | Admitting: Family

## 2016-07-07 ENCOUNTER — Ambulatory Visit (INDEPENDENT_AMBULATORY_CARE_PROVIDER_SITE_OTHER): Payer: 59 | Admitting: Family

## 2016-07-07 VITALS — BP 120/70 | HR 58 | Temp 98.1°F | Ht 69.0 in | Wt 257.2 lb

## 2016-07-07 DIAGNOSIS — Z1151 Encounter for screening for human papillomavirus (HPV): Secondary | ICD-10-CM | POA: Insufficient documentation

## 2016-07-07 DIAGNOSIS — Z01419 Encounter for gynecological examination (general) (routine) without abnormal findings: Secondary | ICD-10-CM | POA: Diagnosis not present

## 2016-07-07 DIAGNOSIS — T783XXA Angioneurotic edema, initial encounter: Secondary | ICD-10-CM | POA: Diagnosis not present

## 2016-07-07 DIAGNOSIS — Z0000 Encounter for general adult medical examination without abnormal findings: Secondary | ICD-10-CM | POA: Diagnosis not present

## 2016-07-07 DIAGNOSIS — G43009 Migraine without aura, not intractable, without status migrainosus: Secondary | ICD-10-CM | POA: Diagnosis not present

## 2016-07-07 MED ORDER — SUMATRIPTAN SUCCINATE 100 MG PO TABS: ORAL_TABLET | ORAL | 0 refills | Status: DC

## 2016-07-07 NOTE — Assessment & Plan Note (Signed)
No further episodes. Politely declines refill epinephrine pen.

## 2016-07-07 NOTE — Patient Instructions (Addendum)
Pleasure meeting you Please follow up for annual skin check with derm as we discussed.    Health Maintenance, Female Adopting a healthy lifestyle and getting preventive care can go a long way to promote health and wellness. Talk with your health care provider about what schedule of regular examinations is right for you. This is a good chance for you to check in with your provider about disease prevention and staying healthy. In between checkups, there are plenty of things you can do on your own. Experts have done a lot of research about which lifestyle changes and preventive measures are most likely to keep you healthy. Ask your health care provider for more information. Weight and diet Eat a healthy diet  Be sure to include plenty of vegetables, fruits, low-fat dairy products, and lean protein.  Do not eat a lot of foods high in solid fats, added sugars, or salt.  Get regular exercise. This is one of the most important things you can do for your health.  Most adults should exercise for at least 150 minutes each week. The exercise should increase your heart rate and make you sweat (moderate-intensity exercise).  Most adults should also do strengthening exercises at least twice a week. This is in addition to the moderate-intensity exercise. Maintain a healthy weight  Body mass index (BMI) is a measurement that can be used to identify possible weight problems. It estimates body fat based on height and weight. Your health care provider can help determine your BMI and help you achieve or maintain a healthy weight.  For females 75 years of age and older:  A BMI below 18.5 is considered underweight.  A BMI of 18.5 to 24.9 is normal.  A BMI of 25 to 29.9 is considered overweight.  A BMI of 30 and above is considered obese. Watch levels of cholesterol and blood lipids  You should start having your blood tested for lipids and cholesterol at 37 years of age, then have this test every 5  years.  You may need to have your cholesterol levels checked more often if:  Your lipid or cholesterol levels are high.  You are older than 37 years of age.  You are at high risk for heart disease. Cancer screening Lung Cancer  Lung cancer screening is recommended for adults 33-24 years old who are at high risk for lung cancer because of a history of smoking.  A yearly low-dose CT scan of the lungs is recommended for people who:  Currently smoke.  Have quit within the past 15 years.  Have at least a 30-pack-year history of smoking. A pack year is smoking an average of one pack of cigarettes a day for 1 year.  Yearly screening should continue until it has been 15 years since you quit.  Yearly screening should stop if you develop a health problem that would prevent you from having lung cancer treatment. Breast Cancer  Practice breast self-awareness. This means understanding how your breasts normally appear and feel.  It also means doing regular breast self-exams. Let your health care provider know about any changes, no matter how small.  If you are in your 20s or 30s, you should have a clinical breast exam (CBE) by a health care provider every 1-3 years as part of a regular health exam.  If you are 71 or older, have a CBE every year. Also consider having a breast X-ray (mammogram) every year.  If you have a family history of breast cancer, talk to  your health care provider about genetic screening.  If you are at high risk for breast cancer, talk to your health care provider about having an MRI and a mammogram every year.  Breast cancer gene (BRCA) assessment is recommended for women who have family members with BRCA-related cancers. BRCA-related cancers include:  Breast.  Ovarian.  Tubal.  Peritoneal cancers.  Results of the assessment will determine the need for genetic counseling and BRCA1 and BRCA2 testing. Cervical Cancer  Your health care provider may recommend  that you be screened regularly for cancer of the pelvic organs (ovaries, uterus, and vagina). This screening involves a pelvic examination, including checking for microscopic changes to the surface of your cervix (Pap test). You may be encouraged to have this screening done every 3 years, beginning at age 68.  For women ages 47-65, health care providers may recommend pelvic exams and Pap testing every 3 years, or they may recommend the Pap and pelvic exam, combined with testing for human papilloma virus (HPV), every 5 years. Some types of HPV increase your risk of cervical cancer. Testing for HPV may also be done on women of any age with unclear Pap test results.  Other health care providers may not recommend any screening for nonpregnant women who are considered low risk for pelvic cancer and who do not have symptoms. Ask your health care provider if a screening pelvic exam is right for you.  If you have had past treatment for cervical cancer or a condition that could lead to cancer, you need Pap tests and screening for cancer for at least 20 years after your treatment. If Pap tests have been discontinued, your risk factors (such as having a new sexual partner) need to be reassessed to determine if screening should resume. Some women have medical problems that increase the chance of getting cervical cancer. In these cases, your health care provider may recommend more frequent screening and Pap tests. Colorectal Cancer  This type of cancer can be detected and often prevented.  Routine colorectal cancer screening usually begins at 37 years of age and continues through 38 years of age.  Your health care provider may recommend screening at an earlier age if you have risk factors for colon cancer.  Your health care provider may also recommend using home test kits to check for hidden blood in the stool.  A small camera at the end of a tube can be used to examine your colon directly (sigmoidoscopy or  colonoscopy). This is done to check for the earliest forms of colorectal cancer.  Routine screening usually begins at age 24.  Direct examination of the colon should be repeated every 5-10 years through 37 years of age. However, you may need to be screened more often if early forms of precancerous polyps or small growths are found. Skin Cancer  Check your skin from head to toe regularly.  Tell your health care provider about any new moles or changes in moles, especially if there is a change in a mole's shape or color.  Also tell your health care provider if you have a mole that is larger than the size of a pencil eraser.  Always use sunscreen. Apply sunscreen liberally and repeatedly throughout the day.  Protect yourself by wearing long sleeves, pants, a wide-brimmed hat, and sunglasses whenever you are outside. Heart disease, diabetes, and high blood pressure  High blood pressure causes heart disease and increases the risk of stroke. High blood pressure is more likely to develop in:  People who have blood pressure in the high end of the normal range (130-139/85-89 mm Hg).  People who are overweight or obese.  People who are African American.  If you are 60-50 years of age, have your blood pressure checked every 3-5 years. If you are 33 years of age or older, have your blood pressure checked every year. You should have your blood pressure measured twice-once when you are at a hospital or clinic, and once when you are not at a hospital or clinic. Record the average of the two measurements. To check your blood pressure when you are not at a hospital or clinic, you can use:  An automated blood pressure machine at a pharmacy.  A home blood pressure monitor.  If you are between 27 years and 42 years old, ask your health care provider if you should take aspirin to prevent strokes.  Have regular diabetes screenings. This involves taking a blood sample to check your fasting blood sugar  level.  If you are at a normal weight and have a low risk for diabetes, have this test once every three years after 37 years of age.  If you are overweight and have a high risk for diabetes, consider being tested at a younger age or more often. Preventing infection Hepatitis B  If you have a higher risk for hepatitis B, you should be screened for this virus. You are considered at high risk for hepatitis B if:  You were born in a country where hepatitis B is common. Ask your health care provider which countries are considered high risk.  Your parents were born in a high-risk country, and you have not been immunized against hepatitis B (hepatitis B vaccine).  You have HIV or AIDS.  You use needles to inject street drugs.  You live with someone who has hepatitis B.  You have had sex with someone who has hepatitis B.  You get hemodialysis treatment.  You take certain medicines for conditions, including cancer, organ transplantation, and autoimmune conditions. Hepatitis C  Blood testing is recommended for:  Everyone born from [birth year] through 1966.  Anyone with known risk factors for hepatitis C. Sexually transmitted infections (STIs)  You should be screened for sexually transmitted infections (STIs) including gonorrhea and chlamydia if:  You are sexually active and are younger than 37 years of age.  You are older than 37 years of age and your health care provider tells you that you are at risk for this type of infection.  Your sexual activity has changed since you were last screened and you are at an increased risk for chlamydia or gonorrhea. Ask your health care provider if you are at risk.  If you do not have HIV, but are at risk, it may be recommended that you take a prescription medicine daily to prevent HIV infection. This is called pre-exposure prophylaxis (PrEP). You are considered at risk if:  You are sexually active and do not regularly use condoms or know the HIV status  of your partner(s).  You take drugs by injection.  You are sexually active with a partner who has HIV. Talk with your health care provider about whether you are at high risk of being infected with HIV. If you choose to begin PrEP, you should first be tested for HIV. You should then be tested every 3 months for as long as you are taking PrEP. Pregnancy  If you are premenopausal and you may become pregnant, ask your health care provider about  preconception counseling.  If you may become pregnant, take 400 to 800 micrograms (mcg) of folic acid every day.  If you want to prevent pregnancy, talk to your health care provider about birth control (contraception). Osteoporosis and menopause  Osteoporosis is a disease in which the bones lose minerals and strength with aging. This can result in serious bone fractures. Your risk for osteoporosis can be identified using a bone density scan.  If you are 54 years of age or older, or if you are at risk for osteoporosis and fractures, ask your health care provider if you should be screened.  Ask your health care provider whether you should take a calcium or vitamin D supplement to lower your risk for osteoporosis.  Menopause may have certain physical symptoms and risks.  Hormone replacement therapy may reduce some of these symptoms and risks. Talk to your health care provider about whether hormone replacement therapy is right for you. Follow these instructions at home:  Schedule regular health, dental, and eye exams.  Stay current with your immunizations.  Do not use any tobacco products including cigarettes, chewing tobacco, or electronic cigarettes.  If you are pregnant, do not drink alcohol.  If you are breastfeeding, limit how much and how often you drink alcohol.  Limit alcohol intake to no more than 1 drink per day for nonpregnant women. One drink equals 12 ounces of beer, 5 ounces of wine, or 1 ounces of hard liquor.  Do not use street  drugs.  Do not share needles.  Ask your health care provider for help if you need support or information about quitting drugs.  Tell your health care provider if you often feel depressed.  Tell your health care provider if you have ever been abused or do not feel safe at home. This information is not intended to replace advice given to you by your health care provider. Make sure you discuss any questions you have with your health care provider. Document Released: 11/02/2010 Document Revised: 09/25/2015 Document Reviewed: 01/21/2015 Elsevier Interactive Patient Education  2017 [employer].

## 2016-07-07 NOTE — Progress Notes (Signed)
Subjective:    Patient ID: Kara Vaughn, female    DOB: [birth year], 36 y.o.   MRN: [id number]  CC: Kara Vaughn is a 35 y.o. female who presents today for physical exam.    HPI: HA- No 'bad HA in long time', in over a year. Describes triggers as perfumes and bright light. HA would resolve with rest. Present frontal pain. No aura, confusion, numbness, tingling.  HA unchanged from HA's in the past.  H/o lip swelling when at [facility] a few years ago. Lips would swell. No SOB, difficulty swallowing. Seen by allergist and no triggers found. No longer carrying epi pen.     Colorectal Cancer Screening: No early family history. Breast Cancer Screening: No early family history. Cervical Cancer Screening: UTD, 2016, negative for malignancy, HPV. Had a positive in 20's and not having regular periods on mirena.  Bone Health screening/DEXA for 65+: No increased fracture risk. Defer screening at this time. Lung Cancer Screening: Doesn't have 30 year pack year history and age > 67 years.       Tetanus - utd        HIV Screening- Candidate for  Labs: Screening labs today. Exercise: Gets regular exercise.  Alcohol use: rarely Smoking/tobacco use: Nonsmoker.  Regular dental exams: In need of dental exam. Wears seat belt: Yes Skin: no new skin lesions  HISTORY:  Past Medical History:  Diagnosis Date  . HPV test positive 2001  . Migraine     Past Surgical History:  Procedure Laterality Date  . CYST REMOVAL TRUNK  07/2012   [facility]  . VAGINAL DELIVERY     3   Family History  Problem Relation Age of Onset  . Thyroid disease Father   . Heart disease Father     2011, MI  . Thyroid disease Sister   . Cancer Maternal Aunt 60    ?colon cancer  . Cancer Paternal Aunt     esophageal  . COPD Maternal Grandfather   . Cancer Paternal Grandmother 71    ?uterine cancer, breast cancer, bone cancer      ALLERGIES: Patient has no known allergies.  Current  Outpatient Prescriptions on File Prior to Visit  Medication Sig Dispense Refill  . Multiple Vitamin (MULTIVITAMIN) tablet Take 1 tablet by mouth daily.    Marland Kitchen EPINEPHrine 0.3 mg/0.3 mL IJ SOAJ injection Inject 0.3 mLs (0.3 mg total) into the muscle once. 2 Device 1   No current facility-administered medications on file prior to visit.     Social History  Substance Use Topics  . Smoking status: Never Smoker  . Smokeless tobacco: Never Used  . Alcohol use Yes     Comment: Rarely    Review of Systems  Constitutional: Negative for chills, fever and unexpected weight change.  HENT: Negative for congestion.   Respiratory: Negative for cough.   Cardiovascular: Negative for chest pain, palpitations and leg swelling.  Gastrointestinal: Negative for nausea and vomiting.  Musculoskeletal: Negative for arthralgias and myalgias.  Skin: Negative for rash.  Neurological: Negative for headaches.  Hematological: Negative for adenopathy.  Psychiatric/Behavioral: Negative for confusion.      Objective:    BP 120/70   Pulse (!) 58   Temp 98.1 F (36.7 C) (Oral)   Ht 5\' 9"  (1.753 m)   Wt 257 lb 3.2 oz (116.7 kg)   SpO2 96%   BMI 37.98 kg/m   BP Readings from Last 3 Encounters:  07/07/16 120/70  03/21/15  104/71  02/07/14 110/68   Wt Readings from Last 3 Encounters:  07/07/16 257 lb 3.2 oz (116.7 kg)  03/21/15 232 lb (105.2 kg)  02/07/14 273 lb 8 oz (124.1 kg)    Physical Exam  Constitutional: She appears well-developed and well-nourished.  Eyes: Conjunctivae are normal.  Neck: No thyroid mass and no thyromegaly present.  Cardiovascular: Normal rate, regular rhythm, normal heart sounds and normal pulses.   Pulmonary/Chest: Effort normal and breath sounds normal. She has no wheezes. She has no rhonchi. She has no rales. Right breast exhibits no inverted nipple, no mass, no nipple discharge, no skin change and no tenderness. Left breast exhibits no inverted nipple, no mass, no nipple  discharge, no skin change and no tenderness. Breasts are symmetrical.  No masses or asymmetry appreciated during CBE.  Genitourinary: Uterus is not enlarged, not fixed and not tender. Cervix exhibits no motion tenderness, no discharge and no friability. Right adnexum displays no mass, no tenderness and no fullness. Left adnexum displays no mass, no tenderness and no fullness.  Genitourinary Comments: Pap performed. No CMT. Unable to appreciated ovaries. IUD strings seen.   Lymphadenopathy:       Head (right side): No submental, no submandibular, no tonsillar, no preauricular, no posterior auricular and no occipital adenopathy present.       Head (left side): No submental, no submandibular, no tonsillar, no preauricular, no posterior auricular and no occipital adenopathy present.       Right cervical: No superficial cervical, no deep cervical and no posterior cervical adenopathy present.      Left cervical: No superficial cervical, no deep cervical and no posterior cervical adenopathy present.    She has no axillary adenopathy.       Right axillary: No pectoral and no lateral adenopathy present.       Left axillary: No pectoral and no lateral adenopathy present. Neurological: She is alert.  Skin: Skin is warm and dry.  Psychiatric: She has a normal mood and affect. Her speech is normal and behavior is normal. Thought content normal.  Vitals reviewed.      Assessment & Plan:   Problem List Items Addressed This Visit      Cardiovascular and Mediastinum   Migraine    Headache pattern similar to headaches in the past. Has not had a headache in over a year. Would like to keep Imitrex on hand just in case. Refilled medication.      Relevant Medications   SUMAtriptan (IMITREX) 100 MG tablet     Other   Angioedema of lips    No further episodes. Politely declines refill epinephrine pen.      Routine general medical examination at a health care facility - Primary    Based on patient  preference since she had a positive HPV in her 92s, pap performed. Breast exam performed. Up-to-date on immunizations. Screening labs including HIV consented for. Encouraged continued exercise.      Relevant Orders   CBC with Differential/Platelet   Comprehensive metabolic panel   Hemoglobin A1c   HIV antibody   Lipid panel   TSH   VITAMIN D 25 Hydroxy (Vit-D Deficiency, Fractures)   Cytology - PAP       I have changed Kara Vaughn's SUMAtriptan. I am also having her maintain her multivitamin and EPINEPHrine.   Meds ordered this encounter  Medications  . SUMAtriptan (IMITREX) 100 MG tablet    Sig: 100 mg tablet by mouth 1. May repeat dose 1 after  2 hours.    Dispense:  10 tablet    Refill:  0    Order Specific Question:   Supervising Provider    Answer:   Crecencio Mc [[id number]]    Return precautions given.   Risks, benefits, and alternatives of the medications and treatment plan prescribed today were discussed, and patient expressed understanding.   Education regarding symptom management and diagnosis given to patient on AVS.   Continue to follow with Mable Paris, FNP for routine health maintenance.   Kara Vaughn and I agreed with plan.   Mable Paris, FNP

## 2016-07-07 NOTE — Progress Notes (Signed)
Pre visit review using our clinic review tool, if applicable. No additional management support is needed unless otherwise documented below in the visit note. 

## 2016-07-07 NOTE — Assessment & Plan Note (Signed)
Based on patient preference since she had a positive HPV in her 69s, pap performed. Breast exam performed. Up-to-date on immunizations. Screening labs including HIV consented for. Encouraged continued exercise.

## 2016-07-07 NOTE — Assessment & Plan Note (Signed)
Headache pattern similar to headaches in the past. Has not had a headache in over a year. Would like to keep Imitrex on hand just in case. Refilled medication.

## 2016-07-09 LAB — CYTOLOGY - PAP
DIAGNOSIS: NEGATIVE
HPV: NOT DETECTED

## 2016-07-22 ENCOUNTER — Other Ambulatory Visit: Payer: 59

## 2016-07-30 ENCOUNTER — Other Ambulatory Visit: Payer: 59

## 2016-09-20 ENCOUNTER — Encounter: Payer: Self-pay | Admitting: Physician Assistant

## 2016-09-20 ENCOUNTER — Ambulatory Visit: Payer: Self-pay | Admitting: Physician Assistant

## 2016-09-20 VITALS — BP 120/80 | HR 72 | Temp 98.4°F

## 2016-09-20 DIAGNOSIS — M545 Low back pain: Secondary | ICD-10-CM

## 2016-09-20 LAB — POCT URINALYSIS DIPSTICK
Bilirubin, UA: NEGATIVE
GLUCOSE UA: NEGATIVE
Ketones, UA: NEGATIVE
Leukocytes, UA: NEGATIVE
NITRITE UA: NEGATIVE
PH UA: 7 (ref 5.0–8.0)
Protein, UA: NEGATIVE
Spec Grav, UA: 1.01 (ref 1.010–1.025)
UROBILINOGEN UA: 0.2 U/dL

## 2016-09-20 NOTE — Progress Notes (Signed)
S:  C/o mid back pain for 3-4 day, no known injury, pain is worse with movement, also having urinary freq, no burning, no pain, no blood, no hx kidney stones;  denies numbness, tingling, or changes in bowel/urinary habits,  Using otc meds without relief Remainder ros neg  O:  Vitals wnl, nad, lungs c t a, cv rrr, spine nontender, muscles in mid back are a little tender , no decreased rom,  Neg slr, pt walks without difficulty, no foot drop noted, n/v intact, no cva tenderness, ua wnl  A: acute back pain,   P: use wet heat followed by ice, stretches, return to clinic if not better in 3 t 5 days, return earlier if worsening,

## 2017-06-01 ENCOUNTER — Encounter: Payer: Self-pay | Admitting: Family

## 2017-06-02 NOTE — Telephone Encounter (Signed)
Patient stated she is better now. No appt needed.

## 2017-06-30 DIAGNOSIS — Z113 Encounter for screening for infections with a predominantly sexual mode of transmission: Secondary | ICD-10-CM | POA: Diagnosis not present

## 2017-06-30 DIAGNOSIS — Z30433 Encounter for removal and reinsertion of intrauterine contraceptive device: Secondary | ICD-10-CM | POA: Diagnosis not present

## 2017-07-01 DIAGNOSIS — Z30433 Encounter for removal and reinsertion of intrauterine contraceptive device: Secondary | ICD-10-CM | POA: Diagnosis not present

## 2017-07-14 ENCOUNTER — Encounter: Payer: Self-pay | Admitting: Family

## 2017-07-15 ENCOUNTER — Other Ambulatory Visit (HOSPITAL_COMMUNITY)
Admission: RE | Admit: 2017-07-15 | Discharge: 2017-07-15 | Disposition: A | Discharge status: Home / Self Care | Payer: 59 | Source: Ambulatory Visit | Attending: Family | Admitting: Family

## 2017-07-15 ENCOUNTER — Encounter: Payer: Self-pay | Admitting: Family

## 2017-07-15 ENCOUNTER — Ambulatory Visit (INDEPENDENT_AMBULATORY_CARE_PROVIDER_SITE_OTHER): Payer: 59 | Admitting: Family

## 2017-07-15 VITALS — BP 104/70 | HR 86 | Temp 98.5°F | Resp 20 | Ht 68.25 in | Wt 251.0 lb

## 2017-07-15 DIAGNOSIS — Z0000 Encounter for general adult medical examination without abnormal findings: Secondary | ICD-10-CM

## 2017-07-15 LAB — CBC WITH DIFFERENTIAL/PLATELET
BASOS PCT: 0.8 % (ref 0.0–3.0)
Basophils Absolute: 0.1 10*3/uL (ref 0.0–0.1)
EOS ABS: 0.2 10*3/uL (ref 0.0–0.7)
Eosinophils Relative: 2.9 % (ref 0.0–5.0)
HEMATOCRIT: 43.1 % (ref 36.0–46.0)
Hemoglobin: 14.8 g/dL (ref 12.0–15.0)
Lymphocytes Relative: 23.1 % (ref 12.0–46.0)
Lymphs Abs: 1.6 10*3/uL (ref 0.7–4.0)
MCHC: 34.4 g/dL (ref 30.0–36.0)
MCV: 87.6 fl (ref 78.0–100.0)
MONO ABS: 0.5 10*3/uL (ref 0.1–1.0)
Monocytes Relative: 6.5 % (ref 3.0–12.0)
NEUTROS ABS: 4.7 10*3/uL (ref 1.4–7.7)
Neutrophils Relative %: 66.7 % (ref 43.0–77.0)
PLATELETS: 273 10*3/uL (ref 150.0–400.0)
RBC: 4.92 Mil/uL (ref 3.87–5.11)
RDW: 13.6 % (ref 11.5–15.5)
WBC: 7.1 10*3/uL (ref 4.0–10.5)

## 2017-07-15 LAB — HEMOGLOBIN A1C: Hgb A1c MFr Bld: 5.7 % (ref 4.6–6.5)

## 2017-07-15 LAB — LIPID PANEL
CHOLESTEROL: 126 mg/dL (ref 0–200)
HDL: 48 mg/dL (ref >39.00)
LDL Cholesterol: 59 mg/dL (ref 0–99)
NonHDL: 78.16
Total CHOL/HDL Ratio: 3
Triglycerides: 94 mg/dL (ref 0.0–149.0)
VLDL: 18.8 mg/dL (ref 0.0–40.0)

## 2017-07-15 LAB — COMPREHENSIVE METABOLIC PANEL
ALT: 15 U/L (ref 0–35)
AST: 17 U/L (ref 0–37)
Albumin: 4.2 g/dL (ref 3.5–5.2)
Alkaline Phosphatase: 43 U/L (ref 39–117)
BUN: 10 mg/dL (ref 6–23)
CALCIUM: 8.9 mg/dL (ref 8.4–10.5)
CHLORIDE: 102 meq/L (ref 96–112)
CO2: 31 mEq/L (ref 19–32)
CREATININE: 0.71 mg/dL (ref 0.40–1.20)
GFR: 98.15 mL/min (ref >60.00)
Glucose, Bld: 99 mg/dL (ref 70–99)
POTASSIUM: 4.1 meq/L (ref 3.5–5.1)
SODIUM: 138 meq/L (ref 135–145)
Total Bilirubin: 1.1 mg/dL (ref 0.2–1.2)
Total Protein: 6.2 g/dL (ref 6.0–8.3)

## 2017-07-15 LAB — VITAMIN D 25 HYDROXY (VIT D DEFICIENCY, FRACTURES): VITD: 14.48 ng/mL — ABNORMAL LOW (ref 30.00–100.00)

## 2017-07-15 LAB — TSH: TSH: 1.77 u[IU]/mL (ref 0.35–4.50)

## 2017-07-15 NOTE — Progress Notes (Signed)
Subjective:    Patient ID: Kara Vaughn, female    DOB: 1980-09-21, 37 y.o.   MRN: [id number]  CC: Kara Vaughn is a 37 y.o. female who presents today for physical exam.    HPI: Doing well  No complaints    Colorectal Cancer Screening: no early family h/o prior to 30 years.  Maternal aunt was 44 years old. Discussed screening at 37yo in absence of symptoms and she agreed.  Breast Cancer Screening: grandmother at age 56. Would like to do a baseline.  Cervical Cancer Screening: UTD however would like done today; recently mirena IUD placed 2 weeks. No pelvic complaints, h/o + HPV, LEEP procedure and would like annual Pap smear; consents to any additional costs for this.  Bone Health screening/DEXA for 65+: No increased fracture risk. Defer screening at this time. Lung Cancer Screening: Doesn't have 30 year pack year history and age > 77 years. Immunizations       Tetanus -utd         HIV Screening- Candidate for, consents Labs: Screening labs today. Exercise: Gets regular exercise.  Alcohol use: rare Smoking/tobacco use: Nonsmoker.  Wears seat belt: Yes. Skin: hasn' t been in a while; would like to continue screening  HISTORY:  Past Medical History:  Diagnosis Date  . HPV test positive 2001  . Migraine     Past Surgical History:  Procedure Laterality Date  . CYST REMOVAL TRUNK  07/2012   [facility]  . VAGINAL DELIVERY     3   Family History  Problem Relation Age of Onset  . Thyroid disease Father   . Heart disease Father        2011, MI  . Thyroid disease Sister   . Cancer Maternal Aunt 60       ?colon cancer  . Cancer Paternal Aunt        esophageal  . COPD Maternal Grandfather   . Cancer Paternal Grandmother 58       ?uterine cancer, breast cancer, bone cancer      ALLERGIES: Patient has no known allergies.  Current Outpatient Medications on File Prior to Visit  Medication Sig Dispense Refill  . levonorgestrel (MIRENA) 20 MCG/24HR IUD 1 each  by Intrauterine route once.    . Multiple Vitamin (MULTIVITAMIN) tablet Take 1 tablet by mouth daily.    . SUMAtriptan (IMITREX) 100 MG tablet 100 mg tablet by mouth 1. May repeat dose 1 after 2 hours. 10 tablet 0  . EPINEPHrine 0.3 mg/0.3 mL IJ SOAJ injection Inject 0.3 mLs (0.3 mg total) into the muscle once. (Patient not taking: Reported on 09/20/2016) 2 Device 1   No current facility-administered medications on file prior to visit.     Social History   Tobacco Use  . Smoking status: Never Smoker  . Smokeless tobacco: Never Used  Substance Use Topics  . Alcohol use: Yes    Comment: Rarely  . Drug use: No    Review of Systems  Constitutional: Negative for chills, fever and unexpected weight change.  HENT: Negative for congestion.   Respiratory: Negative for cough.   Cardiovascular: Negative for chest pain, palpitations and leg swelling.  Gastrointestinal: Negative for abdominal distention, nausea and vomiting.  Genitourinary: Negative for pelvic pain.  Musculoskeletal: Negative for arthralgias and myalgias.  Skin: Negative for rash.  Neurological: Negative for headaches.  Hematological: Negative for adenopathy.  Psychiatric/Behavioral: Negative for confusion.      Objective:    BP 104/70 (BP  Location: Left Arm, Patient Position: Sitting, Cuff Size: Large)   Pulse 86   Temp 98.5 F (36.9 C) (Oral)   Resp 20   Ht 5' 8.25" (1.734 m)   Wt 251 lb (113.9 kg)   SpO2 97%   BMI 37.89 kg/m   BP Readings from Last 3 Encounters:  07/15/17 104/70  09/20/16 120/80  07/07/16 120/70   Wt Readings from Last 3 Encounters:  07/15/17 251 lb (113.9 kg)  07/07/16 257 lb 3.2 oz (116.7 kg)  03/21/15 232 lb (105.2 kg)    Physical Exam  Constitutional: She appears well-developed and well-nourished.  Eyes: Conjunctivae are normal.  Neck: No thyroid mass and no thyromegaly present.  Cardiovascular: Normal rate, regular rhythm, normal heart sounds and normal pulses.    Pulmonary/Chest: Effort normal and breath sounds normal. She has no wheezes. She has no rhonchi. She has no rales. Right breast exhibits no inverted nipple, no mass, no nipple discharge, no skin change and no tenderness. Left breast exhibits no inverted nipple, no mass, no nipple discharge, no skin change and no tenderness. Breasts are symmetrical.  No masses or asymmetry appreciated during CBE.  Genitourinary: Uterus is not enlarged, not fixed and not tender. Cervix exhibits no motion tenderness, no discharge and no friability. Right adnexum displays no mass, no tenderness and no fullness. Left adnexum displays no mass, no tenderness and no fullness.  Genitourinary Comments: Pap performed. No CMT. Unable to appreciated ovaries. IUD string visualized from os  Lymphadenopathy:       Head (right side): No submental, no submandibular, no tonsillar, no preauricular, no posterior auricular and no occipital adenopathy present.       Head (left side): No submental, no submandibular, no tonsillar, no preauricular, no posterior auricular and no occipital adenopathy present.       Right cervical: No superficial cervical, no deep cervical and no posterior cervical adenopathy present.      Left cervical: No superficial cervical, no deep cervical and no posterior cervical adenopathy present.    She has no axillary adenopathy.       Right axillary: No pectoral and no lateral adenopathy present.       Left axillary: No pectoral and no lateral adenopathy present. Neurological: She is alert.  Skin: Skin is warm and dry.  Psychiatric: She has a normal mood and affect. Her speech is normal and behavior is normal. Thought content normal.  Vitals reviewed.      Assessment & Plan:   Problem List Items Addressed This Visit      Other   Routine general medical examination at a health care facility - Primary    Clinical breast exam performed today and Pap smear.  Order for mammogram for baseline.  Patient  understands to call and schedule.  Encouraged exercise.  Referral to dermatology for annual skin check.      Relevant Orders   MM SCREENING BREAST TOMO BILATERAL   CBC with Differential/Platelet   Comprehensive metabolic panel   Hemoglobin A1c   Lipid panel   Cytology - PAP   TSH   VITAMIN D 25 Hydroxy (Vit-D Deficiency, Fractures)   HIV antibody   Ambulatory referral to Dermatology       I am having Rayne Du. Vaughn maintain her multivitamin, EPINEPHrine, SUMAtriptan, and levonorgestrel.   No orders of the defined types were placed in this encounter.   Return precautions given.   Risks, benefits, and alternatives of the medications and treatment plan prescribed today were  discussed, and patient expressed understanding.   Education regarding symptom management and diagnosis given to patient on AVS.   Continue to follow with Burnard Hawthorne, FNP for routine health maintenance.   Rayne Du Vaughn and I agreed with plan.   Mable Paris, FNP

## 2017-07-15 NOTE — Assessment & Plan Note (Addendum)
Clinical breast exam performed today and Pap smear.  Order for mammogram for baseline.  Patient understands to call and schedule.  Encouraged exercise.  Referral to dermatology for annual skin check.

## 2017-07-15 NOTE — Patient Instructions (Addendum)
Labs  Dermatology- please make an appointment  We placed a referral for mammogram this year. I asked that you call one the below locations and schedule this when it is convenient for you.   As discussed, I would like you to ask for 3D mammogram over the traditional 2D mammogram as new evidence suggest 3D is superior.   Please note that NOT all insurance companies cover 3D and you may have to pay a higher copay. You may call your insurance company to further clarify your benefits.   Options for [facility]  [place], [place]  * [facility] [place], [place] * Note if you ask for 3D mammogram at this location, you must request [place], [place] location*   Health Maintenance, Female Adopting a healthy lifestyle and getting preventive care can go a long way to promote health and wellness. Talk with your health care provider about what schedule of regular examinations is right for you. This is a good chance for you to check in with your provider about disease prevention and staying healthy. In between checkups, there are plenty of things you can do on your own. Experts have done a lot of research about which lifestyle changes and preventive measures are most likely to keep you healthy. Ask your health care provider for more information. Weight and diet Eat a healthy diet  Be sure to include plenty of vegetables, fruits, low-fat dairy products, and lean protein.  Do not eat a lot of foods high in solid fats, added sugars, or salt.  Get regular exercise. This is one of the most important things you can do for your health. ? Most adults should exercise for at least 150 minutes each week. The exercise should increase your heart rate and make you sweat (moderate-intensity exercise). ? Most adults should also do strengthening exercises at least  twice a week. This is in addition to the moderate-intensity exercise.  Maintain a healthy weight  Body mass index (BMI) is a measurement that can be used to identify possible weight problems. It estimates body fat based on height and weight. Your health care provider can help determine your BMI and help you achieve or maintain a healthy weight.  For females 62 years of age and older: ? A BMI below 18.5 is considered underweight. ? A BMI of 18.5 to 24.9 is normal. ? A BMI of 25 to 29.9 is considered overweight. ? A BMI of 30 and above is considered obese.  Watch levels of cholesterol and blood lipids  You should start having your blood tested for lipids and cholesterol at 38 years of age, then have this test every 5 years.  You may need to have your cholesterol levels checked more often if: ? Your lipid or cholesterol levels are high. ? You are older than 38 years of age. ? You are at high risk for heart disease.  Cancer screening Lung Cancer  Lung cancer screening is recommended for adults 70-28 years old who are at high risk for lung cancer because of a history of smoking.  A yearly low-dose CT scan of the lungs is recommended for people who: ? Currently smoke. ? Have quit within the past 15 years. ? Have at least a 30-pack-year history of smoking. A pack year is smoking an average of one pack of cigarettes a day for 1 year.  Yearly screening should continue until it has been 15 years since you quit.  Yearly screening should stop if you develop a health problem that would prevent you from having lung cancer treatment.  Breast Cancer  Practice breast self-awareness. This means understanding how your breasts normally appear and feel.  It also means doing regular breast self-exams. Let your health care provider know about any changes, no matter how small.  If you are in your 20s or 30s, you should have a clinical breast exam (CBE) by a health care provider every 1-3 years as  part of a regular health exam.  If you are 33 or older, have a CBE every year. Also consider having a breast X-ray (mammogram) every year.  If you have a family history of breast cancer, talk to your health care provider about genetic screening.  If you are at high risk for breast cancer, talk to your health care provider about having an MRI and a mammogram every year.  Breast cancer gene (BRCA) assessment is recommended for women who have family members with BRCA-related cancers. BRCA-related cancers include: ? Breast. ? Ovarian. ? Tubal. ? Peritoneal cancers.  Results of the assessment will determine the need for genetic counseling and BRCA1 and BRCA2 testing.  Cervical Cancer Your health care provider may recommend that you be screened regularly for cancer of the pelvic organs (ovaries, uterus, and vagina). This screening involves a pelvic examination, including checking for microscopic changes to the surface of your cervix (Pap test). You may be encouraged to have this screening done every 3 years, beginning at age 34.  For women ages 62-65, health care providers may recommend pelvic exams and Pap testing every 3 years, or they may recommend the Pap and pelvic exam, combined with testing for human papilloma virus (HPV), every 5 years. Some types of HPV increase your risk of cervical cancer. Testing for HPV may also be done on women of any age with unclear Pap test results.  Other health care providers may not recommend any screening for nonpregnant women who are considered low risk for pelvic cancer and who do not have symptoms. Ask your health care provider if a screening pelvic exam is right for you.  If you have had past treatment for cervical cancer or a condition that could lead to cancer, you need Pap tests and screening for cancer for at least 20 years after your treatment. If Pap tests have been discontinued, your risk factors (such as having a new sexual partner) need to be  reassessed to determine if screening should resume. Some women have medical problems that increase the chance of getting cervical cancer. In these cases, your health care provider may recommend more frequent screening and Pap tests.  Colorectal Cancer  This type of cancer can be detected and often prevented.  Routine colorectal cancer screening usually begins at 38 years of age and continues through 38 years of age.  Your health care provider may recommend screening at an earlier age if you have risk factors for colon cancer.  Your health care provider may also recommend using home test kits to check for hidden blood in the stool.  A small camera at the end of a tube can be used to examine your colon directly (sigmoidoscopy or colonoscopy). This is done to check for the earliest forms of colorectal cancer.  Routine screening usually begins at age 70.  Direct examination of the colon should be repeated every 5-10 years through 38 years of age.  However, you may need to be screened more often if early forms of precancerous polyps or small growths are found.  Skin Cancer  Check your skin from head to toe regularly.  Tell your health care provider about any new moles or changes in moles, especially if there is a change in a mole's shape or color.  Also tell your health care provider if you have a mole that is larger than the size of a pencil eraser.  Always use sunscreen. Apply sunscreen liberally and repeatedly throughout the day.  Protect yourself by wearing long sleeves, pants, a wide-brimmed hat, and sunglasses whenever you are outside.  Heart disease, diabetes, and high blood pressure  High blood pressure causes heart disease and increases the risk of stroke. High blood pressure is more likely to develop in: ? People who have blood pressure in the high end of the normal range (130-139/85-89 mm Hg). ? People who are overweight or obese. ? People who are African American.  If you  are 22-12 years of age, have your blood pressure checked every 3-5 years. If you are 32 years of age or older, have your blood pressure checked every year. You should have your blood pressure measured twice-once when you are at a hospital or clinic, and once when you are not at a hospital or clinic. Record the average of the two measurements. To check your blood pressure when you are not at a hospital or clinic, you can use: ? An automated blood pressure machine at a pharmacy. ? A home blood pressure monitor.  If you are between 41 years and 34 years old, ask your health care provider if you should take aspirin to prevent strokes.  Have regular diabetes screenings. This involves taking a blood sample to check your fasting blood sugar level. ? If you are at a normal weight and have a low risk for diabetes, have this test once every three years after 38 years of age. ? If you are overweight and have a high risk for diabetes, consider being tested at a younger age or more often. Preventing infection Hepatitis B  If you have a higher risk for hepatitis B, you should be screened for this virus. You are considered at high risk for hepatitis B if: ? You were born in a country where hepatitis B is common. Ask your health care provider which countries are considered high risk. ? Your parents were born in a high-risk country, and you have not been immunized against hepatitis B (hepatitis B vaccine). ? You have HIV or AIDS. ? You use needles to inject street drugs. ? You live with someone who has hepatitis B. ? You have had sex with someone who has hepatitis B. ? You get hemodialysis treatment. ? You take certain medicines for conditions, including cancer, organ transplantation, and autoimmune conditions.  Hepatitis C  Blood testing is recommended for: ? Everyone born from [birth year] through 1966. ? Anyone with known risk factors for hepatitis C.  Sexually transmitted infections (STIs)  You should be  screened for sexually transmitted infections (STIs) including gonorrhea and chlamydia if: ? You are sexually active and are younger than 38 years of age. ? You are older than 38 years of age and your health care provider tells you that you are at risk for this type of infection. ? Your sexual activity has changed since you were last screened and you are at an increased risk for chlamydia or gonorrhea. Ask your health care provider if  you are at risk.  If you do not have HIV, but are at risk, it may be recommended that you take a prescription medicine daily to prevent HIV infection. This is called pre-exposure prophylaxis (PrEP). You are considered at risk if: ? You are sexually active and do not regularly use condoms or know the HIV status of your partner(s). ? You take drugs by injection. ? You are sexually active with a partner who has HIV.  Talk with your health care provider about whether you are at high risk of being infected with HIV. If you choose to begin PrEP, you should first be tested for HIV. You should then be tested every 3 months for as long as you are taking PrEP. Pregnancy  If you are premenopausal and you may become pregnant, ask your health care provider about preconception counseling.  If you may become pregnant, take 400 to 800 micrograms (mcg) of folic acid every day.  If you want to prevent pregnancy, talk to your health care provider about birth control (contraception). Osteoporosis and menopause  Osteoporosis is a disease in which the bones lose minerals and strength with aging. This can result in serious bone fractures. Your risk for osteoporosis can be identified using a bone density scan.  If you are 57 years of age or older, or if you are at risk for osteoporosis and fractures, ask your health care provider if you should be screened.  Ask your health care provider whether you should take a calcium or vitamin D supplement to lower your risk for  osteoporosis.  Menopause may have certain physical symptoms and risks.  Hormone replacement therapy may reduce some of these symptoms and risks. Talk to your health care provider about whether hormone replacement therapy is right for you. Follow these instructions at home:  Schedule regular health, dental, and eye exams.  Stay current with your immunizations.  Do not use any tobacco products including cigarettes, chewing tobacco, or electronic cigarettes.  If you are pregnant, do not drink alcohol.  If you are breastfeeding, limit how much and how often you drink alcohol.  Limit alcohol intake to no more than 1 drink per day for nonpregnant women. One drink equals 12 ounces of beer, 5 ounces of wine, or 1 ounces of hard liquor.  Do not use street drugs.  Do not share needles.  Ask your health care provider for help if you need support or information about quitting drugs.  Tell your health care provider if you often feel depressed.  Tell your health care provider if you have ever been abused or do not feel safe at home. This information is not intended to replace advice given to you by your health care provider. Make sure you discuss any questions you have with your health care provider. Document Released: 11/02/2010 Document Revised: 09/25/2015 Document Reviewed: 01/21/2015 Elsevier Interactive Patient Education  [employer].

## 2017-07-15 NOTE — Progress Notes (Signed)
Pre-visit discussion using our clinic review tool. No additional management support is needed unless otherwise documented below in the visit note.  

## 2017-07-16 LAB — HIV ANTIBODY (ROUTINE TESTING W REFLEX): HIV 1&2 Ab, 4th Generation: NONREACTIVE

## 2017-07-19 LAB — CYTOLOGY - PAP
Diagnosis: NEGATIVE
HPV (WINDOPATH): NOT DETECTED

## 2017-07-28 DIAGNOSIS — Z30431 Encounter for routine checking of intrauterine contraceptive device: Secondary | ICD-10-CM | POA: Diagnosis not present

## 2017-09-13 ENCOUNTER — Encounter: Payer: Self-pay | Admitting: Family Medicine

## 2017-09-13 ENCOUNTER — Ambulatory Visit (INDEPENDENT_AMBULATORY_CARE_PROVIDER_SITE_OTHER): Payer: 59 | Admitting: Family Medicine

## 2017-09-13 VITALS — BP 122/70 | HR 73 | Temp 98.4°F | Resp 16 | Wt 248.2 lb

## 2017-09-13 DIAGNOSIS — J069 Acute upper respiratory infection, unspecified: Secondary | ICD-10-CM | POA: Diagnosis not present

## 2017-09-13 MED ORDER — BENZONATATE 100 MG PO CAPS: 100.0000 mg | ORAL_CAPSULE | Freq: Three times a day (TID) | ORAL | 0 refills | Status: DC

## 2017-09-13 MED ORDER — FLUTICASONE PROPIONATE 50 MCG/ACT NA SUSP: 2.0000 | Freq: Every day | NASAL | 6 refills | Status: DC

## 2017-09-13 MED ORDER — PREDNISONE 10 MG PO TABS: ORAL_TABLET | ORAL | 0 refills | Status: DC

## 2017-09-13 NOTE — Patient Instructions (Signed)
Your symptoms are most likely related to a viral illness. Please drink plenty of water so that your urine is pale yellow or clear. Also, get plenty of rest, use tylenol or ibuprofen as needed for discomfort and follow up if symptoms do not improve in 3 to 4 days, worsen, or you develop a fever >101.    Upper Respiratory Infection, Adult Most upper respiratory infections (URIs) are caused by a virus. A URI affects the nose, throat, and upper air passages. The most common type of URI is often called "the common cold." Follow these instructions at home:  Take medicines only as told by your doctor.  Gargle warm saltwater or take cough drops to comfort your throat as told by your doctor.  Use a warm mist humidifier or inhale steam from a shower to increase air moisture. This may make it easier to breathe.  Drink enough fluid to keep your pee (urine) clear or pale yellow.  Eat soups and other clear broths.  Have a healthy diet.  Rest as needed.  Go back to work when your fever is gone or your doctor says it is okay. ? You may need to stay home longer to avoid giving your URI to others. ? You can also wear a face mask and wash your hands often to prevent spread of the virus.  Use your inhaler more if you have asthma.  Do not use any tobacco products, including cigarettes, chewing tobacco, or electronic cigarettes. If you need help quitting, ask your doctor. Contact a doctor if:  You are getting worse, not better.  Your symptoms are not helped by medicine.  You have chills.  You are getting more short of breath.  You have brown or red mucus.  You have yellow or brown discharge from your nose.  You have pain in your face, especially when you bend forward.  You have a fever.  You have puffy (swollen) neck glands.  You have pain while swallowing.  You have white areas in the back of your throat. Get help right away if:  You have very bad or constant: ? Headache. ? Ear  pain. ? Pain in your forehead, behind your eyes, and over your cheekbones (sinus pain). ? Chest pain.  You have long-lasting (chronic) lung disease and any of the following: ? Wheezing. ? Long-lasting cough. ? Coughing up blood. ? A change in your usual mucus.  You have a stiff neck.  You have changes in your: ? Vision. ? Hearing. ? Thinking. ? Mood. This information is not intended to replace advice given to you by your health care provider. Make sure you discuss any questions you have with your health care provider. Document Released: 10/06/2007 Document Revised: 12/21/2015 Document Reviewed: 07/25/2013 Elsevier Interactive Patient Education  2018 [employer].

## 2017-09-13 NOTE — Progress Notes (Signed)
Patient ID: PASQUALINA COLASURDO, female   DOB: Sep 28, 1980, 37 y.o.   MRN: [id number]  PCP: Burnard Hawthorne, FNP  Subjective:  Clover Feehan Arizmendi is a 37 y.o. year old very pleasant female patient who presents with including chest congestion and cough that is productive of green sputum. Associated occasional wheezing at home per patient. -started: 2 days ago, symptoms are not improving -previous treatments: Mucinex DM has provided limited benefit -sick contacts/travel/risks: denies flu exposure.  Recent sick contact exposure at work. -Hx of: allergies No history of asthma She is not a smoker No recent antibiotic use  ROS-denies fever, SOB, NVD, tooth pain  Pertinent Past Medical History- seasonal allergies  Medications- reviewed  Current Outpatient Medications  Medication Sig Dispense Refill  . EPINEPHrine 0.3 mg/0.3 mL IJ SOAJ injection Inject 0.3 mLs (0.3 mg total) into the muscle once. 2 Device 1  . levonorgestrel (MIRENA) 20 MCG/24HR IUD 1 each by Intrauterine route once.    . Multiple Vitamin (MULTIVITAMIN) tablet Take 1 tablet by mouth daily.    . SUMAtriptan (IMITREX) 100 MG tablet 100 mg tablet by mouth 1. May repeat dose 1 after 2 hours. 10 tablet 0  . benzonatate (TESSALON) 100 MG capsule Take 1 capsule (100 mg total) by mouth 3 (three) times daily. 20 capsule 0  . fluticasone (FLONASE) 50 MCG/ACT nasal spray Place 2 sprays into both nostrils daily. 16 g 6  . predniSONE (DELTASONE) 10 MG tablet Take 4 tablets once daily for 2 days, 3 tabs daily for 2 days, 2 tabs daily for 2 days, 1 tab daily for 2 days. 20 tablet 0   No current facility-administered medications for this visit.     Objective: BP 122/70 (BP Location: Left Arm, Patient Position: Sitting, Cuff Size: Large)   Pulse 73   Temp 98.4 F (36.9 C) (Oral)   Resp 16   Wt 248 lb 4 oz (112.6 kg)   SpO2 97%   BMI 37.47 kg/m  Gen: NAD, resting comfortably HEENT: Turbinates mildly erythematous, TMs normal  bilaterally, oropharynx clear and moist, no sinus tenderness CV: RRR no murmurs rubs or gallops Lungs: CTAB no crackles, wheeze, rhonchi Abdomen: soft/nontender/nondistended/normal bowel sounds. No rebound or guarding.  Ext: no edema Skin: warm, dry, no rash Neuro: grossly normal, moves all extremities  Assessment/Plan: 1. Upper respiratory infection, viral History and exam are most consistent with viral illness. VSS today. Patient reports that she is going out of town and her cough is the most concerning symptom. We discussed treatment options and opted for a prednisone taper as she has reported wheezing at home however this was not appreciated during exam today. Further advised use of benzonatate that can be used during the day and she will initiate flonase for symptoms that have been associated with rhinitis. Advised patient on supportive measures:  Get rest, drink plenty of fluids, and use tylenol or ibuprofen as needed for discomfort. Follow up if fever >101, if symptoms worsen or if symptoms are not improved in 3 to 4 days. Patient verbalizes understanding.  Finally, we reviewed reasons to return to care including if symptoms worsen or persist or new concerns arise-once again particularly SOB or fever.  - predniSONE (DELTASONE) 10 MG tablet; Take 4 tablets once daily for 2 days, 3 tabs daily for 2 days, 2 tabs daily for 2 days, 1 tab daily for 2 days.  Dispense: 20 tablet; Refill: 0 - fluticasone (FLONASE) 50 MCG/ACT nasal spray; Place 2 sprays into both nostrils daily.  Dispense: 16 g; Refill: 6 - benzonatate (TESSALON) 100 MG capsule; Take 1 capsule (100 mg total) by mouth 3 (three) times daily.  Dispense: 20 capsule; Refill: 0  Delano Metz, FNP-C

## 2017-09-19 ENCOUNTER — Telehealth: Payer: Self-pay | Admitting: Family

## 2017-09-19 ENCOUNTER — Encounter: Payer: Self-pay | Admitting: Family Medicine

## 2017-09-19 DIAGNOSIS — D2372 Other benign neoplasm of skin of left lower limb, including hip: Secondary | ICD-10-CM | POA: Diagnosis not present

## 2017-09-19 DIAGNOSIS — D2339 Other benign neoplasm of skin of other parts of face: Secondary | ICD-10-CM | POA: Diagnosis not present

## 2017-09-19 DIAGNOSIS — D485 Neoplasm of uncertain behavior of skin: Secondary | ICD-10-CM | POA: Diagnosis not present

## 2017-09-19 NOTE — Telephone Encounter (Signed)
Spoke with patient , she doesn't feel like she is getting better with Prednisone .   Only has 1 day left of Prednisone , tessalon perles helped .   Still has dry cough, non productive , worse at night. She states its not in chest.   No fever or wheezing. Please advise.

## 2017-09-19 NOTE — Telephone Encounter (Signed)
Copied from [place] [id number]. Topic: Quick Communication - See Telephone Encounter >> Sep 19, 2017  2:31 PM Margot Ables wrote: CRM for notification. See Telephone encounter for: 09/19/17.  Pt calling regarding mychart msg she sent and requesting f/u from Joycelyn Schmid or Gregary Signs today if possible for cough medicine to help at night/sleep. [place], [place] [phone] (Phone) [phone] (Fax)

## 2017-09-19 NOTE — Telephone Encounter (Signed)
Please advise 

## 2017-09-20 MED ORDER — PROMETHAZINE-DM 6.25-15 MG/5ML PO SYRP: 5.0000 mL | ORAL_SOLUTION | Freq: Four times a day (QID) | ORAL | 0 refills | Status: DC | PRN

## 2017-09-20 NOTE — Telephone Encounter (Signed)
Cough suppressant sent to pharmacy. If symptoms worsen, recommend follow up for further evaluation.

## 2017-09-21 NOTE — Telephone Encounter (Signed)
Mychart message sent.

## 2017-09-27 NOTE — Telephone Encounter (Signed)
Patient has appointment with Joycelyn Schmid 09/28/17

## 2017-09-28 ENCOUNTER — Encounter: Payer: Self-pay | Admitting: Family

## 2017-09-28 ENCOUNTER — Ambulatory Visit (INDEPENDENT_AMBULATORY_CARE_PROVIDER_SITE_OTHER): Payer: 59 | Admitting: Family

## 2017-09-28 VITALS — BP 112/64 | HR 67 | Temp 99.2°F | Wt 244.4 lb

## 2017-09-28 DIAGNOSIS — J40 Bronchitis, not specified as acute or chronic: Secondary | ICD-10-CM

## 2017-09-28 MED ORDER — AZITHROMYCIN 250 MG PO TABS: ORAL_TABLET | ORAL | 0 refills | Status: DC

## 2017-09-28 NOTE — Patient Instructions (Signed)
Suspect secondary bacterial infection Start zpack Probiotics  Let me know if not better - as discussed, may be acid reflux, allergies.   Acute Bronchitis, Adult Acute bronchitis is sudden (acute) swelling of the air tubes (bronchi) in the lungs. Acute bronchitis causes these tubes to fill with mucus, which can make it hard to breathe. It can also cause coughing or wheezing. In adults, acute bronchitis usually goes away within 2 weeks. A cough caused by bronchitis may last up to 3 weeks. Smoking, allergies, and asthma can make the condition worse. Repeated episodes of bronchitis may cause further lung problems, such as chronic obstructive pulmonary disease (COPD). What are the causes? This condition can be caused by germs and by substances that irritate the lungs, including:  Cold and flu viruses. This condition is most often caused by the same virus that causes a cold.  Bacteria.  Exposure to tobacco smoke, dust, fumes, and air pollution.  What increases the risk? This condition is more likely to develop in people who:  Have close contact with someone with acute bronchitis.  Are exposed to lung irritants, such as tobacco smoke, dust, fumes, and vapors.  Have a weak immune system.  Have a respiratory condition such as asthma.  What are the signs or symptoms? Symptoms of this condition include:  A cough.  Coughing up clear, yellow, or green mucus.  Wheezing.  Chest congestion.  Shortness of breath.  A fever.  Body aches.  Chills.  A sore throat.  How is this diagnosed? This condition is usually diagnosed with a physical exam. During the exam, your health care provider may order tests, such as chest X-rays, to rule out other conditions. He or she may also:  Test a sample of your mucus for bacterial infection.  Check the level of oxygen in your blood. This is done to check for pneumonia.  Do a chest X-ray or lung function testing to rule out pneumonia and other  conditions.  Perform blood tests.  Your health care provider will also ask about your symptoms and medical history. How is this treated? Most cases of acute bronchitis clear up over time without treatment. Your health care provider may recommend:  Drinking more fluids. Drinking more makes your mucus thinner, which may make it easier to breathe.  Taking a medicine for a fever or cough.  Taking an antibiotic medicine.  Using an inhaler to help improve shortness of breath and to control a cough.  Using a cool mist vaporizer or humidifier to make it easier to breathe.  Follow these instructions at home: Medicines  Take over-the-counter and prescription medicines only as told by your health care provider.  If you were prescribed an antibiotic, take it as told by your health care provider. Do not stop taking the antibiotic even if you start to feel better. General instructions  Get plenty of rest.  Drink enough fluids to keep your urine clear or pale yellow.  Avoid smoking and secondhand smoke. Exposure to cigarette smoke or irritating chemicals will make bronchitis worse. If you smoke and you need help quitting, ask your health care provider. Quitting smoking will help your lungs heal faster.  Use an inhaler, cool mist vaporizer, or humidifier as told by your health care provider.  Keep all follow-up visits as told by your health care provider. This is important. How is this prevented? To lower your risk of getting this condition again:  Wash your hands often with soap and water. If soap and water  are not available, use hand sanitizer.  Avoid contact with people who have cold symptoms.  Try not to touch your hands to your mouth, nose, or eyes.  Make sure to get the flu shot every year.  Contact a health care provider if:  Your symptoms do not improve in 2 weeks of treatment. Get help right away if:  You cough up blood.  You have chest pain.  You have severe shortness  of breath.  You become dehydrated.  You faint or keep feeling like you are going to faint.  You keep vomiting.  You have a severe headache.  Your fever or chills gets worse. This information is not intended to replace advice given to you by your health care provider. Make sure you discuss any questions you have with your health care provider. Document Released: 05/27/2004 Document Revised: 11/12/2015 Document Reviewed: 10/08/2015 Elsevier Interactive Patient Education  [employer].

## 2017-09-28 NOTE — Progress Notes (Signed)
Subjective:    Patient ID: Kara Vaughn, female    DOB: 10-29-1980, 37 y.o.   MRN: [id number]  CC: Kara Vaughn is a 37 y.o. female who presents today for an acute visit.    HPI: CC: dry cough x 2 weeks, unchnaged. Coughs all day long. No relief with prednisone.   Feels like congestion however 'not coming up. ' Endorses clear nasal congestion  Tried tussion, mucinex, promethazine at night, flonase. Meant to try allegra. claritin makes her sleepy.  No chills, fever, sob, wheezing, sinus pressure, ear pain, post nasal drip.   Non smoker.   Seen 09/13/17 for URI; given prednisone, tessalon, flonase  HISTORY:  Past Medical History:  Diagnosis Date  . HPV test positive 2001  . Migraine    Past Surgical History:  Procedure Laterality Date  . CYST REMOVAL TRUNK  07/2012   [facility]  . VAGINAL DELIVERY     3   Family History  Problem Relation Age of Onset  . Thyroid disease Father   . Heart disease Father        2011, MI  . Thyroid disease Sister   . Cancer Maternal Aunt 60       ?colon cancer  . Cancer Paternal Aunt        esophageal  . COPD Maternal Grandfather   . Cancer Paternal Grandmother 46       ?uterine cancer, breast cancer, bone cancer    Allergies: Patient has no known allergies. Current Outpatient Medications on File Prior to Visit  Medication Sig Dispense Refill  . benzonatate (TESSALON) 100 MG capsule Take 1 capsule (100 mg total) by mouth 3 (three) times daily. 20 capsule 0  . EPINEPHrine 0.3 mg/0.3 mL IJ SOAJ injection Inject 0.3 mLs (0.3 mg total) into the muscle once. 2 Device 1  . levonorgestrel (MIRENA) 20 MCG/24HR IUD 1 each by Intrauterine route once.    . Multiple Vitamin (MULTIVITAMIN) tablet Take 1 tablet by mouth daily.    . promethazine-dextromethorphan (PROMETHAZINE-DM) 6.25-15 MG/5ML syrup Take 5 mLs by mouth 4 (four) times daily as needed for cough. 118 mL 0  . SUMAtriptan (IMITREX) 100 MG tablet 100 mg tablet by mouth 1. May  repeat dose 1 after 2 hours. 10 tablet 0  . fluticasone (FLONASE) 50 MCG/ACT nasal spray Place 2 sprays into both nostrils daily. (Patient not taking: Reported on 09/28/2017) 16 g 6   No current facility-administered medications on file prior to visit.     Social History   Tobacco Use  . Smoking status: Never Smoker  . Smokeless tobacco: Never Used  Substance Use Topics  . Alcohol use: Yes    Comment: Rarely  . Drug use: No    Review of Systems  Constitutional: Negative for chills and fever.  HENT: Positive for rhinorrhea. Negative for congestion, ear pain, postnasal drip, sinus pressure, sore throat, trouble swallowing and voice change.   Respiratory: Positive for cough. Negative for shortness of breath and wheezing.   Cardiovascular: Negative for chest pain and palpitations.  Gastrointestinal: Negative for nausea and vomiting.      Objective:    BP 112/64 (BP Location: Left Arm, Patient Position: Sitting, Cuff Size: Large)   Pulse 67   Temp 99.2 F (37.3 C) (Oral)   Wt 244 lb 6 oz (110.8 kg)   SpO2 97%   BMI 36.89 kg/m    Physical Exam  Constitutional: She appears well-developed and well-nourished.  HENT:  Head: Normocephalic and  atraumatic.  Right Ear: Hearing, tympanic membrane, external ear and ear canal normal. No drainage, swelling or tenderness. No foreign bodies. Tympanic membrane is not erythematous and not bulging. No middle ear effusion. No decreased hearing is noted.  Left Ear: Hearing, tympanic membrane, external ear and ear canal normal. No drainage, swelling or tenderness. No foreign bodies. Tympanic membrane is not erythematous and not bulging.  No middle ear effusion. No decreased hearing is noted.  Nose: Nose normal. No rhinorrhea. Right sinus exhibits no maxillary sinus tenderness and no frontal sinus tenderness. Left sinus exhibits no maxillary sinus tenderness and no frontal sinus tenderness.  Mouth/Throat: Uvula is midline, oropharynx is clear and  moist and mucous membranes are normal. No oropharyngeal exudate, posterior oropharyngeal edema, posterior oropharyngeal erythema or tonsillar abscesses.  Eyes: Conjunctivae are normal.  Cardiovascular: Regular rhythm, normal heart sounds and normal pulses.  Pulmonary/Chest: Effort normal and breath sounds normal. She has no wheezes. She has no rhonchi. She has no rales.  Lymphadenopathy:       Head (right side): No submental, no submandibular, no tonsillar, no preauricular, no posterior auricular and no occipital adenopathy present.       Head (left side): No submental, no submandibular, no tonsillar, no preauricular, no posterior auricular and no occipital adenopathy present.    She has no cervical adenopathy.  Neurological: She is alert.  Skin: Skin is warm and dry.  Psychiatric: She has a normal mood and affect. Her speech is normal and behavior is normal. Thought content normal.  Vitals reviewed.      Assessment & Plan:   1. Bronchitis Well-appearing and afebrile.  Based on duration of symptoms, and failed conservative therapy, we will go ahead and start antibiotic therapy today.  Discussed with patient alternative differentials including allergic rhinitis, GERD.  Patient let me know if no better on azithromycin. - azithromycin (ZITHROMAX) 250 MG tablet; Tale 500 mg PO on day 1, then 250 mg PO q24h x 4 days.  Dispense: 6 tablet; Refill: 0    I have discontinued Simya P. Arizmendi's predniSONE. I am also having her maintain her multivitamin, EPINEPHrine, SUMAtriptan, levonorgestrel, fluticasone, benzonatate, and promethazine-dextromethorphan.   No orders of the defined types were placed in this encounter.   Return precautions given.   Risks, benefits, and alternatives of the medications and treatment plan prescribed today were discussed, and patient expressed understanding.   Education regarding symptom management and diagnosis given to patient on AVS.  Continue to follow with  Burnard Hawthorne, FNP for routine health maintenance.   Rayne Du Arizmendi and I agreed with plan.   Mable Paris, FNP

## 2017-09-29 ENCOUNTER — Ambulatory Visit
Admission: RE | Admit: 2017-09-29 | Discharge: 2017-09-29 | Disposition: A | Discharge status: Home / Self Care | Payer: 59 | Source: Ambulatory Visit | Attending: Family | Admitting: Family

## 2017-09-29 ENCOUNTER — Other Ambulatory Visit: Payer: Self-pay | Admitting: Family

## 2017-09-29 DIAGNOSIS — R928 Other abnormal and inconclusive findings on diagnostic imaging of breast: Secondary | ICD-10-CM

## 2017-09-29 DIAGNOSIS — Z0000 Encounter for general adult medical examination without abnormal findings: Secondary | ICD-10-CM | POA: Insufficient documentation

## 2017-09-29 DIAGNOSIS — Z1231 Encounter for screening mammogram for malignant neoplasm of breast: Secondary | ICD-10-CM | POA: Diagnosis not present

## 2017-09-29 DIAGNOSIS — R921 Mammographic calcification found on diagnostic imaging of breast: Secondary | ICD-10-CM

## 2017-09-29 DIAGNOSIS — N6310 Unspecified lump in the right breast, unspecified quadrant: Secondary | ICD-10-CM

## 2017-10-11 ENCOUNTER — Ambulatory Visit
Admission: RE | Admit: 2017-10-11 | Discharge: 2017-10-11 | Disposition: A | Discharge status: Home / Self Care | Payer: 59 | Source: Ambulatory Visit | Attending: Family | Admitting: Family

## 2017-10-11 ENCOUNTER — Other Ambulatory Visit: Payer: Self-pay | Admitting: Family

## 2017-10-11 DIAGNOSIS — N6489 Other specified disorders of breast: Secondary | ICD-10-CM | POA: Diagnosis not present

## 2017-10-11 DIAGNOSIS — R921 Mammographic calcification found on diagnostic imaging of breast: Secondary | ICD-10-CM

## 2017-10-11 DIAGNOSIS — N6310 Unspecified lump in the right breast, unspecified quadrant: Secondary | ICD-10-CM | POA: Insufficient documentation

## 2017-10-11 DIAGNOSIS — R928 Other abnormal and inconclusive findings on diagnostic imaging of breast: Secondary | ICD-10-CM

## 2017-10-17 ENCOUNTER — Ambulatory Visit
Admission: RE | Admit: 2017-10-17 | Discharge: 2017-10-17 | Disposition: A | Discharge status: Home / Self Care | Payer: 59 | Source: Ambulatory Visit | Attending: Family | Admitting: Family

## 2017-10-17 DIAGNOSIS — R921 Mammographic calcification found on diagnostic imaging of breast: Secondary | ICD-10-CM | POA: Diagnosis not present

## 2017-10-17 DIAGNOSIS — N62 Hypertrophy of breast: Secondary | ICD-10-CM | POA: Diagnosis not present

## 2017-10-17 DIAGNOSIS — R928 Other abnormal and inconclusive findings on diagnostic imaging of breast: Secondary | ICD-10-CM | POA: Diagnosis not present

## 2017-10-17 HISTORY — PX: BREAST BIOPSY: SHX20

## 2017-10-18 LAB — SURGICAL PATHOLOGY

## 2018-03-14 ENCOUNTER — Encounter: Payer: Self-pay | Admitting: Family

## 2018-03-15 ENCOUNTER — Other Ambulatory Visit: Payer: Self-pay | Admitting: Family

## 2018-03-15 DIAGNOSIS — R928 Other abnormal and inconclusive findings on diagnostic imaging of breast: Secondary | ICD-10-CM

## 2018-04-20 ENCOUNTER — Other Ambulatory Visit: Payer: Self-pay | Admitting: Family

## 2018-04-20 ENCOUNTER — Ambulatory Visit
Admission: RE | Admit: 2018-04-20 | Discharge: 2018-04-20 | Disposition: A | Discharge status: Home / Self Care | Payer: 59 | Source: Ambulatory Visit | Attending: Family | Admitting: Family

## 2018-04-20 DIAGNOSIS — R922 Inconclusive mammogram: Secondary | ICD-10-CM | POA: Diagnosis not present

## 2018-04-20 DIAGNOSIS — R928 Other abnormal and inconclusive findings on diagnostic imaging of breast: Secondary | ICD-10-CM | POA: Diagnosis not present

## 2018-04-20 DIAGNOSIS — R921 Mammographic calcification found on diagnostic imaging of breast: Secondary | ICD-10-CM

## 2018-04-20 DIAGNOSIS — N6310 Unspecified lump in the right breast, unspecified quadrant: Secondary | ICD-10-CM

## 2018-04-23 ENCOUNTER — Other Ambulatory Visit: Payer: Self-pay | Admitting: Family

## 2018-04-23 DIAGNOSIS — R928 Other abnormal and inconclusive findings on diagnostic imaging of breast: Secondary | ICD-10-CM

## 2018-05-02 ENCOUNTER — Other Ambulatory Visit: Payer: Self-pay | Admitting: Family Medicine

## 2018-05-05 NOTE — Telephone Encounter (Signed)
Patient stated she was feeling better & declined appointment at this time. She said she would call back if symptoms worsened.

## 2018-05-05 NOTE — Telephone Encounter (Signed)
Call patient We do not not typically refill cough medications unless patient has been seen.  This patient needs office visit.  Please advise we also Saturday clinic this time of year.

## 2018-05-22 ENCOUNTER — Encounter: Payer: Self-pay | Admitting: Family

## 2018-07-21 ENCOUNTER — Ambulatory Visit (INDEPENDENT_AMBULATORY_CARE_PROVIDER_SITE_OTHER): Payer: 59 | Admitting: Family

## 2018-07-21 ENCOUNTER — Encounter: Payer: Self-pay | Admitting: Family

## 2018-07-21 ENCOUNTER — Other Ambulatory Visit: Payer: Self-pay

## 2018-07-21 VITALS — BP 102/60 | HR 68 | Temp 98.1°F | Ht 68.75 in | Wt 239.6 lb

## 2018-07-21 DIAGNOSIS — Z0000 Encounter for general adult medical examination without abnormal findings: Secondary | ICD-10-CM | POA: Diagnosis not present

## 2018-07-21 LAB — CBC WITH DIFFERENTIAL/PLATELET
BASOS ABS: 0.1 10*3/uL (ref 0.0–0.1)
Basophils Relative: 1.4 % (ref 0.0–3.0)
EOS ABS: 0.2 10*3/uL (ref 0.0–0.7)
Eosinophils Relative: 3.6 % (ref 0.0–5.0)
HCT: 43.2 % (ref 36.0–46.0)
Hemoglobin: 14.8 g/dL (ref 12.0–15.0)
LYMPHS ABS: 1.7 10*3/uL (ref 0.7–4.0)
Lymphocytes Relative: 30.8 % (ref 12.0–46.0)
MCHC: 34.2 g/dL (ref 30.0–36.0)
MCV: 88.5 fl (ref 78.0–100.0)
MONOS PCT: 8.3 % (ref 3.0–12.0)
Monocytes Absolute: 0.5 10*3/uL (ref 0.1–1.0)
NEUTROS ABS: 3.1 10*3/uL (ref 1.4–7.7)
NEUTROS PCT: 55.9 % (ref 43.0–77.0)
PLATELETS: 284 10*3/uL (ref 150.0–400.0)
RBC: 4.88 Mil/uL (ref 3.87–5.11)
RDW: 13.3 % (ref 11.5–15.5)
WBC: 5.5 10*3/uL (ref 4.0–10.5)

## 2018-07-21 LAB — LIPID PANEL
CHOL/HDL RATIO: 3
Cholesterol: 149 mg/dL (ref 0–200)
HDL: 56.6 mg/dL (ref >39.00)
LDL CALC: 79 mg/dL (ref 0–99)
NonHDL: 92.62
Triglycerides: 67 mg/dL (ref 0.0–149.0)
VLDL: 13.4 mg/dL (ref 0.0–40.0)

## 2018-07-21 LAB — COMPREHENSIVE METABOLIC PANEL
ALT: 12 U/L (ref 0–35)
AST: 15 U/L (ref 0–37)
Albumin: 4.6 g/dL (ref 3.5–5.2)
Alkaline Phosphatase: 47 U/L (ref 39–117)
BILIRUBIN TOTAL: 1 mg/dL (ref 0.2–1.2)
BUN: 11 mg/dL (ref 6–23)
CHLORIDE: 102 meq/L (ref 96–112)
CO2: 28 meq/L (ref 19–32)
Calcium: 9.2 mg/dL (ref 8.4–10.5)
Creatinine, Ser: 0.77 mg/dL (ref 0.40–1.20)
GFR: 83.64 mL/min (ref >60.00)
GLUCOSE: 90 mg/dL (ref 70–99)
Potassium: 4.4 mEq/L (ref 3.5–5.1)
Sodium: 138 mEq/L (ref 135–145)
Total Protein: 6.5 g/dL (ref 6.0–8.3)

## 2018-07-21 LAB — HEMOGLOBIN A1C: Hgb A1c MFr Bld: 5.5 % (ref 4.6–6.5)

## 2018-07-21 LAB — VITAMIN D 25 HYDROXY (VIT D DEFICIENCY, FRACTURES): VITD: 17.95 ng/mL — ABNORMAL LOW (ref 30.00–100.00)

## 2018-07-21 LAB — TSH: TSH: 1.2 u[IU]/mL (ref 0.35–4.50)

## 2018-07-21 NOTE — Progress Notes (Signed)
Subjective:    Patient ID: Kara Vaughn, female    DOB: 1980-05-29, 38 y.o.   MRN: [id number]  CC: Kara Vaughn is a 38 y.o. female who presents today for physical exam.    HPI: Feels well today  No complaints.    Colorectal Cancer Screening: Colon cancer with aunt, in her 57's. No young family history.  Breast Cancer Screening: Mammogram UTD 04/2018.  Bilateral diagnostic mammogram and right breast ultrasound recommended in 6 months.  This has been scheduled. Cervical Cancer Screening: UTD Bone Health screening/DEXA for 65+: No increased fracture risk. Defer screening at this time.        Tetanus - utd       Labs: Screening labs today. Exercise: Gets regular exercise.  Alcohol use: rarely Smoking/tobacco use: Nonsmoker.  Regular dental exams: UTD Wears seat belt: Yes. Skin: no new lesions. Follows with dermatology  HISTORY:  Past Medical History:  Diagnosis Date  . HPV test positive 2001  . Migraine     Past Surgical History:  Procedure Laterality Date  . BREAST BIOPSY Right 10/17/2017   pseudoangiomatous stromal hyperplasia   . CYST REMOVAL TRUNK  07/2012   [facility]  . VAGINAL DELIVERY     3   Family History  Problem Relation Age of Onset  . Thyroid disease Father   . Heart disease Father        2011, MI  . Colon polyps Father   . Thyroid disease Sister   . Cancer Maternal Aunt 60       ?colon cancer  . Cancer Paternal Aunt        esophageal  . COPD Maternal Grandfather   . Cancer Paternal Grandmother 36       ?uterine cancer, breast cancer, bone cancer  . Breast cancer Paternal Grandmother 66      ALLERGIES: Patient has no known allergies.  Current Outpatient Medications on File Prior to Visit  Medication Sig Dispense Refill  . fluticasone (FLONASE) 50 MCG/ACT nasal spray Place 2 sprays into both nostrils daily. 16 g 6  . levonorgestrel (MIRENA) 20 MCG/24HR IUD 1 each by Intrauterine route once.    . Multiple Vitamin (MULTIVITAMIN)  tablet Take 1 tablet by mouth daily.    Marland Kitchen EPINEPHrine 0.3 mg/0.3 mL IJ SOAJ injection Inject 0.3 mLs (0.3 mg total) into the muscle once. (Patient not taking: Reported on 07/21/2018) 2 Device 1   No current facility-administered medications on file prior to visit.     Social History   Tobacco Use  . Smoking status: Never Smoker  . Smokeless tobacco: Never Used  Substance Use Topics  . Alcohol use: Yes    Comment: Rarely  . Drug use: No    Review of Systems  Constitutional: Negative for chills, fever and unexpected weight change.  HENT: Negative for congestion.   Respiratory: Negative for cough.   Cardiovascular: Negative for chest pain, palpitations and leg swelling.  Gastrointestinal: Negative for nausea and vomiting.  Genitourinary: Negative for pelvic pain, vaginal discharge and vaginal pain.  Musculoskeletal: Negative for arthralgias and myalgias.  Skin: Negative for rash.  Neurological: Negative for headaches.  Hematological: Negative for adenopathy.  Psychiatric/Behavioral: Negative for confusion.      Objective:    BP 102/60 (BP Location: Left Arm, Patient Position: Sitting, Cuff Size: Large)   Pulse 68   Temp 98.1 F (36.7 C)   Ht 5' 8.75" (1.746 m)   Wt 239 lb 9.6 oz (108.7 kg)  SpO2 98%   BMI 35.64 kg/m   BP Readings from Last 3 Encounters:  07/21/18 102/60  09/28/17 112/64  09/13/17 122/70   Wt Readings from Last 3 Encounters:  07/21/18 239 lb 9.6 oz (108.7 kg)  09/28/17 244 lb 6 oz (110.8 kg)  09/13/17 248 lb 4 oz (112.6 kg)    Physical Exam Vitals signs reviewed.  Constitutional:      Appearance: She is well-developed.  Eyes:     Conjunctiva/sclera: Conjunctivae normal.  Neck:     Thyroid: No thyroid mass or thyromegaly.  Cardiovascular:     Rate and Rhythm: Normal rate and regular rhythm.     Pulses: Normal pulses.     Heart sounds: Normal heart sounds.  Pulmonary:     Effort: Pulmonary effort is normal.     Breath sounds: Normal  breath sounds. No wheezing, rhonchi or rales.  Lymphadenopathy:     Head:     Right side of head: No submental, submandibular, tonsillar, preauricular, posterior auricular or occipital adenopathy.     Left side of head: No submental, submandibular, tonsillar, preauricular, posterior auricular or occipital adenopathy.     Cervical: No cervical adenopathy.  Skin:    General: Skin is warm and dry.  Neurological:     Mental Status: She is alert.  Psychiatric:        Speech: Speech normal.        Behavior: Behavior normal.        Thought Content: Thought content normal.        Assessment & Plan:   Problem List Items Addressed This Visit      Other   Routine general medical examination at a health care facility - Primary    In current environment, both patient I felt most comfortable with her not undressing ; we performed a limited physical exam.  We did not do a clinical breast exam.  She understands to return for this.  Referral genetics based on her family history.  Education provided on colon cancer risk assessment and if she should pursue earlier colonoscopy.  Patient will read this on after visit summary and certainly contact the office if she would like a referral to GI as well.  Patient verbalized understanding of all       Relevant Orders   TSH   CBC with Differential/Platelet   Comprehensive metabolic panel   Hemoglobin A1c   Lipid panel   VITAMIN D 25 Hydroxy (Vit-D Deficiency, Fractures)   Ambulatory referral to Hematology / Oncology       I have discontinued Kara Vaughn's SUMAtriptan, benzonatate, promethazine-dextromethorphan, and azithromycin. I am also having her maintain her multivitamin, EPINEPHrine, levonorgestrel, and fluticasone.   No orders of the defined types were placed in this encounter.   Return precautions given.   Risks, benefits, and alternatives of the medications and treatment plan prescribed today were discussed, and patient expressed  understanding.   Education regarding symptom management and diagnosis given to patient on AVS.   Continue to follow with Burnard Hawthorne, FNP for routine health maintenance.   Kara Vaughn and I agreed with plan.   Mable Paris, FNP

## 2018-07-21 NOTE — Assessment & Plan Note (Signed)
In current environment, both patient I felt most comfortable with her not undressing ; we performed a limited physical exam.  We did not do a clinical breast exam.  She understands to return for this.  Referral genetics based on her family history.  Education provided on colon cancer risk assessment and if she should pursue earlier colonoscopy.  Patient will read this on after visit summary and certainly contact the office if she would like a referral to GI as well.  Patient verbalized understanding of all

## 2018-07-21 NOTE — Patient Instructions (Addendum)
In the current environment, we jointly agreed it was most appropriate today to limit our exposure, thus unfortunately your complete physical exam was abbreviated.  Please return for your breast exam in the next couple months.  This very very important we do this.   As discussed today regarding colon cancer risk ( CRC)  and your family history of polyps.   Typically we start screening at 39 years old for AVERAGE risk people.   We assess the risk for CRC at the initial visit for an adult who is age 39 years or older to identify high-risk patients who should begin CRC screening at an earlier age than those at average risk. Subsequent reassessment every three to five years identifies whether the patient or family members have developed factors that raise the patient's level of risk for CRC  Factors important to determine CRC risk can be assessed by asking several questions. A "no" response to all of these questions generally indicates AVERAGE risk.  ?Have you ever had CRC or an adenomatous polyp? A personal history of CRC increases the risk of another primary (metachronous) cancer. Surveillance following CRC is discussed separately. A personal history of adenomatous colorectal polyps increases the risk of CRC . The number and types of polyp lesions guide the determination of the appropriate interval for surveillance.  If there is uncertainty about the patient's personal polyp history, records should be obtained to determine if the patient had an adenomatous polyp. If records cannot be obtained, the patient may recall being told a polyp was found and advised to have a follow-up colonoscopy in five years or sooner, suggesting the polyp was adenomatous.  ?Have any family members had CRC or a documented advanced polyp? An advanced polyp is defined as an advanced adenoma (adenoma ?1 cm, or with high-grade dysplasia, or with villous elements) or advanced serrated lesion (sessile serrated polyp [SSP] ?1 cm, or  traditional serrated adenoma ?1 cm, or SSP with cytologic dysplasia).  The [place] Multi-Society Task Force (MSTF) on Colorectal Cancer recommends that, if there is documentation that an FDR had an advanced adenoma (adenoma ?1 cm, or with high-grade dysplasia, or with villous elements) or polyp requiring surgical excision, this should be weighted the same as having an FDR with CRC when suggesting screening programs [20]. The yield of screening colonoscopy in patients with FDRs who had advanced adenomas is substantially increased. However, the MSTF no longer recommends that patients undergo intensified colon screening without clear documentation that an FDR's polyp was advanced; in the absence of documentation that an FDR's polyp was advanced, it should be assumed it was not advanced   If so, how many family members, were they first-degree relatives (parent, sibling, or child), and at what age was the cancer or polyp first diagnosed? Enhanced screening is warranted for a patient with increased risk of CRC due to a family history of CRC or documented advanced polyp and is described in detail separately. If a family member is reported to have had a polyp but there is lack of available documentation about the type of polyp, the patient is typically screened as if a family member did not have an advanced polyp. If the patient cannot obtain any family history whatsoever related to colorectal cancer or polyps, some experts suggest screening the patient as average risk, although there are no data that evaluate that approach. ?Do you have family members with any of the known genetic syndromes ( such as Lynch Syndrome) that can cause CRC? Patients with a  family history of a known genetic syndrome for CRC may require enhanced screening plus genetic counseling.   Obtaining family history permits determination of the number of First Degree Relatives( FDR) diagnosed with ANY of the following: ?Colorectal cancer  (CRC) ?Documented advanced polyp with ANY of the following: .Advanced adenoma -Adenoma size ?1 cm -Adenoma with high-grade dysplasia -Adenoma with villous elements .Advanced serrated lesion -Sessile serrated polyp (SSP) ?1 cm -Traditional serrated adenoma ?1 cm -SSP with cytologic dysplasia  We suggest using the information about first-degree relatives (FDRs) with ANY of the above to screen as follows: ?One FDR diagnosed at age <60 years - Begin screening at age 5 years, or 10 years before the FDR's diagnosis, whichever is earlier. We suggest colonoscopy every five years. If the patient declines colonoscopy, annual fecal immunochemical testing (FIT) should be offered. ?Two or more FDRs diagnosed at any age - Begin screening at age 59 years, or 10 years before the youngest FDR's diagnosis, whichever is earlier. We suggest colonoscopy every five years. If the patient declines colonoscopy, annual FIT should be offered. ?One FDR diagnosed at age ?32 years - Begin screening at age 11 years, using the same screening options as for average-risk patients, at the same frequency as for average-risk patients. If the only family history is an FDR with a polyp NOT clearly documented as an advanced adenoma or serrated lesion, we suggest that the patient be screened as an average-risk patient, because of the potential inaccuracy of the family history. This is in contrast to patients whose FDR has a documented history of an advanced adenoma or serrated lesion, who should be screened similarly to those with a family history of CRC, as indicated above.  These suggestions are in keeping with the 2017 guidelines from the [place] Multi-Society Task Force (MSTF) on Colorectal Cancer, issued collaboratively on behalf of the [employer], the [employer], and the [place] [facility]   Health Maintenance, Female Adopting a healthy lifestyle and  getting preventive care can go a long way to promote health and wellness. Talk with your health care provider about what schedule of regular examinations is right for you. This is a good chance for you to check in with your provider about disease prevention and staying healthy. In between checkups, there are plenty of things you can do on your own. Experts have done a lot of research about which lifestyle changes and preventive measures are most likely to keep you healthy. Ask your health care provider for more information. Weight and diet Eat a healthy diet  Be sure to include plenty of vegetables, fruits, low-fat dairy products, and lean protein.  Do not eat a lot of foods high in solid fats, added sugars, or salt.  Get regular exercise. This is one of the most important things you can do for your health. ? Most adults should exercise for at least 150 minutes each week. The exercise should increase your heart rate and make you sweat (moderate-intensity exercise). ? Most adults should also do strengthening exercises at least twice a week. This is in addition to the moderate-intensity exercise. Maintain a healthy weight  Body mass index (BMI) is a measurement that can be used to identify possible weight problems. It estimates body fat based on height and weight. Your health care provider can help determine your BMI and help you achieve or maintain a healthy weight.  For females 24 years of age and older: ? A BMI below 18.5 is  considered underweight. ? A BMI of 18.5 to 24.9 is normal. ? A BMI of 25 to 29.9 is considered overweight. ? A BMI of 30 and above is considered obese. Watch levels of cholesterol and blood lipids  You should start having your blood tested for lipids and cholesterol at 39 years of age, then have this test every 5 years.  You may need to have your cholesterol levels checked more often if: ? Your lipid or cholesterol levels are high. ? You are older than 39 years of  age. ? You are at high risk for heart disease. Cancer screening Lung Cancer  Lung cancer screening is recommended for adults 12-67 years old who are at high risk for lung cancer because of a history of smoking.  A yearly low-dose CT scan of the lungs is recommended for people who: ? Currently smoke. ? Have quit within the past 15 years. ? Have at least a 30-pack-year history of smoking. A pack year is smoking an average of one pack of cigarettes a day for 1 year.  Yearly screening should continue until it has been 15 years since you quit.  Yearly screening should stop if you develop a health problem that would prevent you from having lung cancer treatment. Breast Cancer  Practice breast self-awareness. This means understanding how your breasts normally appear and feel.  It also means doing regular breast self-exams. Let your health care provider know about any changes, no matter how small.  If you are in your 20s or 30s, you should have a clinical breast exam (CBE) by a health care provider every 1-3 years as part of a regular health exam.  If you are 40 or older, have a CBE every year. Also consider having a breast X-ray (mammogram) every year.  If you have a family history of breast cancer, talk to your health care provider about genetic screening.  If you are at high risk for breast cancer, talk to your health care provider about having an MRI and a mammogram every year.  Breast cancer gene (BRCA) assessment is recommended for women who have family members with BRCA-related cancers. BRCA-related cancers include: ? Breast. ? Ovarian. ? Tubal. ? Peritoneal cancers.  Results of the assessment will determine the need for genetic counseling and BRCA1 and BRCA2 testing. Cervical Cancer Your health care provider may recommend that you be screened regularly for cancer of the pelvic organs (ovaries, uterus, and vagina). This screening involves a pelvic examination, including checking  for microscopic changes to the surface of your cervix (Pap test). You may be encouraged to have this screening done every 3 years, beginning at age 59.  For women ages 9-65, health care providers may recommend pelvic exams and Pap testing every 3 years, or they may recommend the Pap and pelvic exam, combined with testing for human papilloma virus (HPV), every 5 years. Some types of HPV increase your risk of cervical cancer. Testing for HPV may also be done on women of any age with unclear Pap test results.  Other health care providers may not recommend any screening for nonpregnant women who are considered low risk for pelvic cancer and who do not have symptoms. Ask your health care provider if a screening pelvic exam is right for you.  If you have had past treatment for cervical cancer or a condition that could lead to cancer, you need Pap tests and screening for cancer for at least 20 years after your treatment. If Pap tests have been  discontinued, your risk factors (such as having a new sexual partner) need to be reassessed to determine if screening should resume. Some women have medical problems that increase the chance of getting cervical cancer. In these cases, your health care provider may recommend more frequent screening and Pap tests. Colorectal Cancer  This type of cancer can be detected and often prevented.  Routine colorectal cancer screening usually begins at 39 years of age and continues through 39 years of age.  Your health care provider may recommend screening at an earlier age if you have risk factors for colon cancer.  Your health care provider may also recommend using home test kits to check for hidden blood in the stool.  A small camera at the end of a tube can be used to examine your colon directly (sigmoidoscopy or colonoscopy). This is done to check for the earliest forms of colorectal cancer.  Routine screening usually begins at age 46.  Direct examination of the colon  should be repeated every 5-10 years through 39 years of age. However, you may need to be screened more often if early forms of precancerous polyps or small growths are found. Skin Cancer  Check your skin from head to toe regularly.  Tell your health care provider about any new moles or changes in moles, especially if there is a change in a mole's shape or color.  Also tell your health care provider if you have a mole that is larger than the size of a pencil eraser.  Always use sunscreen. Apply sunscreen liberally and repeatedly throughout the day.  Protect yourself by wearing long sleeves, pants, a wide-brimmed hat, and sunglasses whenever you are outside. Heart disease, diabetes, and high blood pressure  High blood pressure causes heart disease and increases the risk of stroke. High blood pressure is more likely to develop in: ? People who have blood pressure in the high end of the normal range (130-139/85-89 mm Hg). ? People who are overweight or obese. ? People who are African American.  If you are 31-74 years of age, have your blood pressure checked every 3-5 years. If you are 69 years of age or older, have your blood pressure checked every year. You should have your blood pressure measured twice-once when you are at a hospital or clinic, and once when you are not at a hospital or clinic. Record the average of the two measurements. To check your blood pressure when you are not at a hospital or clinic, you can use: ? An automated blood pressure machine at a pharmacy. ? A home blood pressure monitor.  If you are between 26 years and 26 years old, ask your health care provider if you should take aspirin to prevent strokes.  Have regular diabetes screenings. This involves taking a blood sample to check your fasting blood sugar level. ? If you are at a normal weight and have a low risk for diabetes, have this test once every three years after 39 years of age. ? If you are overweight and  have a high risk for diabetes, consider being tested at a younger age or more often. Preventing infection Hepatitis B  If you have a higher risk for hepatitis B, you should be screened for this virus. You are considered at high risk for hepatitis B if: ? You were born in a country where hepatitis B is common. Ask your health care provider which countries are considered high risk. ? Your parents were born in a high-risk country,  and you have not been immunized against hepatitis B (hepatitis B vaccine). ? You have HIV or AIDS. ? You use needles to inject street drugs. ? You live with someone who has hepatitis B. ? You have had sex with someone who has hepatitis B. ? You get hemodialysis treatment. ? You take certain medicines for conditions, including cancer, organ transplantation, and autoimmune conditions. Hepatitis C  Blood testing is recommended for: ? Everyone born from [birth year] through 1966. ? Anyone with known risk factors for hepatitis C. Sexually transmitted infections (STIs)  You should be screened for sexually transmitted infections (STIs) including gonorrhea and chlamydia if: ? You are sexually active and are younger than 39 years of age. ? You are older than 39 years of age and your health care provider tells you that you are at risk for this type of infection. ? Your sexual activity has changed since you were last screened and you are at an increased risk for chlamydia or gonorrhea. Ask your health care provider if you are at risk.  If you do not have HIV, but are at risk, it may be recommended that you take a prescription medicine daily to prevent HIV infection. This is called pre-exposure prophylaxis (PrEP). You are considered at risk if: ? You are sexually active and do not regularly use condoms or know the HIV status of your partner(s). ? You take drugs by injection. ? You are sexually active with a partner who has HIV. Talk with your health care provider about whether you  are at high risk of being infected with HIV. If you choose to begin PrEP, you should first be tested for HIV. You should then be tested every 3 months for as long as you are taking PrEP. Pregnancy  If you are premenopausal and you may become pregnant, ask your health care provider about preconception counseling.  If you may become pregnant, take 400 to 800 micrograms (mcg) of folic acid every day.  If you want to prevent pregnancy, talk to your health care provider about birth control (contraception). Osteoporosis and menopause  Osteoporosis is a disease in which the bones lose minerals and strength with aging. This can result in serious bone fractures. Your risk for osteoporosis can be identified using a bone density scan.  If you are 73 years of age or older, or if you are at risk for osteoporosis and fractures, ask your health care provider if you should be screened.  Ask your health care provider whether you should take a calcium or vitamin D supplement to lower your risk for osteoporosis.  Menopause may have certain physical symptoms and risks.  Hormone replacement therapy may reduce some of these symptoms and risks. Talk to your health care provider about whether hormone replacement therapy is right for you. Follow these instructions at home:  Schedule regular health, dental, and eye exams.  Stay current with your immunizations.  Do not use any tobacco products including cigarettes, chewing tobacco, or electronic cigarettes.  If you are pregnant, do not drink alcohol.  If you are breastfeeding, limit how much and how often you drink alcohol.  Limit alcohol intake to no more than 1 drink per day for nonpregnant women. One drink equals 12 ounces of beer, 5 ounces of wine, or 1 ounces of hard liquor.  Do not use street drugs.  Do not share needles.  Ask your health care provider for help if you need support or information about quitting drugs.  Tell your health care  provider if you often feel depressed.  Tell your health care provider if you have ever been abused or do not feel safe at home. This information is not intended to replace advice given to you by your health care provider. Make sure you discuss any questions you have with your health care provider. Document Released: 11/02/2010 Document Revised: 09/25/2015 Document Reviewed: 01/21/2015 Elsevier Interactive Patient Education  2019 [employer].

## 2018-08-16 ENCOUNTER — Other Ambulatory Visit: Payer: Self-pay

## 2018-08-16 ENCOUNTER — Encounter: Payer: Self-pay | Admitting: Family

## 2018-08-30 ENCOUNTER — Other Ambulatory Visit: Payer: Self-pay

## 2018-08-31 ENCOUNTER — Inpatient Hospital Stay: Payer: 59 | Attending: Oncology | Admitting: Licensed Clinical Social Worker

## 2018-08-31 ENCOUNTER — Encounter: Payer: Self-pay | Admitting: Licensed Clinical Social Worker

## 2018-08-31 DIAGNOSIS — Z8481 Family history of carrier of genetic disease: Secondary | ICD-10-CM | POA: Diagnosis not present

## 2018-08-31 DIAGNOSIS — Z803 Family history of malignant neoplasm of breast: Secondary | ICD-10-CM | POA: Diagnosis not present

## 2018-08-31 DIAGNOSIS — Z8371 Family history of colonic polyps: Secondary | ICD-10-CM | POA: Insufficient documentation

## 2018-08-31 DIAGNOSIS — Z800 Family history of malignant neoplasm of digestive organs: Secondary | ICD-10-CM

## 2018-08-31 DIAGNOSIS — Z83719 Family history of colon polyps, unspecified: Secondary | ICD-10-CM | POA: Insufficient documentation

## 2018-08-31 NOTE — Progress Notes (Signed)
REFERRING PROVIDER: Burnard Hawthorne, FNP [address] [place],  [address]  PRIMARY PROVIDER:  Burnard Hawthorne, FNP  PRIMARY REASON FOR VISIT:  1. Family history of breast cancer   2. Family history of esophageal cancer   3. Family history of colon cancer   4. Family history of colonic polyps   5. Family history of Lynch syndrome   6. Family history of BRCA2 gene positive    I connected with  Kara Vaughn on 08/31/2018 at 11:00 AM EDT by Jackquline Denmark and verified that I am speaking with the correct person using two identifiers.    Patient location: home Provider location: home   HISTORY OF PRESENT ILLNESS:   Kara Vaughn, a 38 y.o. female, was seen for a [facility] cancer genetics consultation at the request of Dr. Vidal Schwalbe due to family history of cancer.  Kara Vaughn presents to clinic today to discuss the possibility of a hereditary predisposition to cancer, genetic testing, and to further clarify her future cancer risks, as well as potential cancer risks for family members.    Kara Vaughn is a 38 y.o. female with no personal history of cancer.    CANCER HISTORY:   No history exists.     RISK FACTORS:  Menarche was at age 28.  First live birth at age: 31 OCP use for approximately 6 months. Ovaries intact: Yes  Hysterectomy: No  Menopausal status: Premenopausal, on Mirena IUD HRT use: 0 years. Colonoscopy: No, not examined. Mammogram within the last year: Yes Number of breast biopsies: 1, benign.  Up to date with pelvic exams: Yes Any excessive radiation exposure in the past: No  Past Medical History:  Diagnosis Date  . Family history of BRCA2 gene positive   . Family history of breast cancer   . Family history of colon cancer   . Family history of colonic polyps   . Family history of esophageal cancer   . Family history of Lynch syndrome   . HPV test positive 2001  . Migraine     Past Surgical History:  Procedure Laterality Date  .  BREAST BIOPSY Right 10/17/2017   pseudoangiomatous stromal hyperplasia   . CYST REMOVAL TRUNK  07/2012   [facility]  . VAGINAL DELIVERY     3    Social History   Socioeconomic History  . Marital status: Legally Separated    Spouse name: Not on file  . Number of children: Not on file  . Years of education: Not on file  . Highest education level: Not on file  Occupational History  . Not on file  Social Needs  . Financial resource strain: Not on file  . Food insecurity:    Worry: Not on file    Inability: Not on file  . Transportation needs:    Medical: Not on file    Non-medical: Not on file  Tobacco Use  . Smoking status: Never Smoker  . Smokeless tobacco: Never Used  Substance and Sexual Activity  . Alcohol use: Yes    Comment: Rarely  . Drug use: No  . Sexual activity: Not on file  Lifestyle  . Physical activity:    Days per week: Not on file    Minutes per session: Not on file  . Stress: Not on file  Relationships  . Social connections:    Talks on phone: Not on file    Gets together: Not on file    Attends religious service: Not on file  Active member of club or organization: Not on file    Attends meetings of clubs or organizations: Not on file    Relationship status: Not on file  Other Topics Concern  . Not on file  Social History Narrative   Lives in [place], [place]. 3 children 90, 63, and 50 years old. Fluent in [place].      Work - [employer] in [employer], sports, [employer]           FAMILY HISTORY:  We obtained a detailed, 4-generation family history.  Significant diagnoses are listed below: Family History  Problem Relation Age of Onset  . Thyroid disease Father   . Heart disease Father        2011, MI  . Colon polyps Father   . Thyroid disease Sister   . Cancer Maternal Aunt 60       colon cancer  . Cancer Paternal Aunt        esophageal  . COPD Maternal Grandfather   . Cancer Paternal Grandmother 41       ?uterine cancer, breast cancer,  bone cancer  . Breast cancer Paternal Grandmother 48  . BRCA 1/2 Cousin        BRCA2+  . Other Cousin        Lynch syndrome (PMS2+)   Kara Vaughn has 2 daughters: Kara Vaughn, age 71 and Kara Vaughn age 44. She also has one son, Kara Vaughn age 80. Kara Vaughn also has a sister, Kara Vaughn, who is 64 and reportedly recently had genetic testing done through [facility]. Kara Vaughn does not have children.  [name] mother is living at 53 with no history of cancer. The patient has 5 maternal uncles, 4 maternal aunts. One of her aunts had colon cancer and is living at 34. No cancers in other aunts/uncles, and no known cancers in her maternal cousins. Her maternal grandmother is living at 67, her grandfather died at 64 and had emphysema.   [name]'s father is living at 65 and has history of colon polyps, she believes he has several every time he goes for a colonoscopy. He has colonoscopies every 3 years. He has a history of smoking. Kara Vaughn has one full paternal aunt who is living at 49, no cancer. She has a paternal half aunt through her paternal grandmother. This half aunt had esophageal cancer at 10, died at 60, and had history of smoking. This half aunt also had a daughter, Loma Sousa (patient's cousin) who had genetic testing and tested positive for both BRCA2 and PMS2, report was available for review. The patient's paternal grandmother had breast cancer at 40, possibly had a uterine cancer but patient is unsure, and reportedly had bone cancer at 61. The patient believes this bone cancer was a new primary and not recurrence. Paternal grandfather died at 13.   Kara Vaughn is aware of previous family history of genetic testing for hereditary cancer risks. Patient's maternal ancestors are of Caucasian descent, and paternal ancestors are of [place] descent. There is no reported Ashkenazi Jewish ancestry. There is no known consanguinity.  GENETIC COUNSELING ASSESSMENT: Kara Vaughn is a 38 y.o. female  with a family history of BRCA2 and Lynch syndrome.  We, therefore, discussed and recommended the following at today's visit.   DISCUSSION:  We discussed that 5-10% of cancer cases are hereditary, and that there are many genes that each have their own cancers and risks associated. Initially we discussed that she does not meet criteria for testing based on her family  history, but that she would meet if we knew her cousin had testing and what she was positive for. Kara Vaughn was able to reach her cousin during the session and forwarded me a copy of the report which showed mutations in both BRCA2 and PMS2. We briefly discussed the BRCA2 and PMS2 genes, as this information did not come to light until the end of the session. We discussed cancers and risks associated with each, as well as management possibilities. We also discussed that [name] is not the most informative person to test, and that her father and others related to her cousin should also have testing. We reviewed the characteristics, features and inheritance patterns of hereditary cancer syndromes. We also discussed genetic testing, including the appropriate family members to test, the process of testing, insurance coverage and turn-around-time for results. We discussed the implications of a negative, positive and/or variant of uncertain significant result. We offered [name] pursue genetic testing for either the two known familial variants, or a larger panel such as the [facility] Multi-Cancer gene panel. Kara Vaughn wished to pursue the panel.   The Multi-Cancer Panel offered by [facility] includes sequencing and/or deletion duplication testing of the following 85 genes: AIP, ALK, APC, ATM, AXIN2,BAP1,  BARD1, BLM, BMPR1A, BRCA1, BRCA2, BRIP1, CASR, CDC73, CDH1, CDK4, CDKN1B, CDKN1C, CDKN2A (p14ARF), CDKN2A (p16INK4a), CEBPA, CHEK2, CTNNA1, DICER1, DIS3L2, EGFR (c.2369C>T, p.Thr790Met variant only), EPCAM (Deletion/duplication testing only),  FH, FLCN, GATA2, GPC3, GREM1 (Promoter region deletion/duplication testing only), HOXB13 (c.251G>A, p.Gly84Glu), HRAS, KIT, MAX, MEN1, MET, MITF (c.952G>A, p.Glu318Lys variant only), MLH1, MSH2, MSH3, MSH6, MUTYH, NBN, NF1, NF2, NTHL1, PALB2, PDGFRA, PHOX2B, PMS2, POLD1, POLE, POT1, PRKAR1A, PTCH1, PTEN, RAD50, RAD51C, RAD51D, RB1, RECQL4, RET,RNF43, RUNX1, SDHAF2, SDHA (sequence changes only), SDHB, SDHC, SDHD, SMAD4, SMARCA4, SMARCB1, SMARCE1, STK11, SUFU, TERC, TERT, TMEM127, TP53, TSC1, TSC2, VHL, WRN and WT1.   Based on [name]'s family history of BRCA2 and PMS2 pathogenic variants, she meets medical criteria for genetic testing. Despite that she meets criteria, she may still have an out of pocket cost. The lab will notify her of an OOP if any.   We discussed that some people do not want to undergo genetic testing due to fear of genetic discrimination.  A federal law called the Genetic Information Non-Discrimination Act (GINA) of 2008 helps protect individuals against genetic discrimination based on their genetic test results.  It impacts both health insurance and employment.  For health insurance, it protects against increased premiums, being kicked off insurance or being forced to take a test in order to be insured.  For employment it protects against hiring, firing and promoting decisions based on genetic test results.  Health status due to a cancer diagnosis is not protected under GINA.  This law does not protect life insurance, disability insurance, or other types of insurance.   PLAN: After considering the risks, benefits, and limitations, Kara Vaughn provided informed consent to pursue genetic testing. A saliva kit was sent to her house and she will send the saliva sample to and the blood sample was sent to [facility] for analysis of the Multi-Cancer Panel.  Results should be available within approximately 2-3 weeks' time, at which point they will be disclosed by telephone to Ms.  Kara Vaughn, as will any additional recommendations warranted by these results. Kara Vaughn will receive a summary of her genetic counseling visit and a copy of her results once available. This information will also be available in Epic.   Based on [name]'s  family history, we recommended her father and paternal relatives have genetic counseling and testing. Kara Vaughn will let us know if we can be of any assistance in coordinating genetic counseling and/or testing for this family member.   Lastly, we encouraged [name] to remain in contact with cancer genetics annually so that we can continuously update the family history and inform her of any changes in cancer genetics and testing that may be of benefit for this family.   [name] questions were answered to her satisfaction today. Our contact information was provided should additional questions or concerns arise. Thank you for the referral and allowing [place] to share in the care of your patient.   Faith Rogue, MS Genetic Counselor [email or web address] Phone: [phone]  The patient was seen for a total of 40 minutes in face-to-face virtual genetic counseling. Dr. Grayland Ormond was available for discussion regarding this case. A genetic counseling intern, Hughie Closs, was present and participated in the session.

## 2018-09-04 NOTE — Telephone Encounter (Signed)
Relation to pt: self  Call back number:  [phone] Pharmacy: [place], [place] [phone] (Phone) [phone] (Fax)     Reason for call:  Patient states head ache are the same and not frequent, allergy related, checking on the status of Rx.

## 2018-09-06 ENCOUNTER — Telehealth: Payer: Self-pay | Admitting: Family

## 2018-09-06 MED ORDER — SUMATRIPTAN SUCCINATE 100 MG PO TABS: 100.0000 mg | ORAL_TABLET | Freq: Once | ORAL | 0 refills | Status: DC

## 2018-09-06 NOTE — Telephone Encounter (Signed)
Refilled imitrex.

## 2018-09-06 NOTE — Telephone Encounter (Signed)
Call pt  She didn't get respond to the below phone message  Can you go over this below with patient and as long as she is NO for the below, please refill sumitriptan  thanks    Kara Vaughn,     I do not have a problem with refilling the medication.     Please confirm that you are not having any worsening headaches or more frequent headaches.     As long as your headaches feel similar to headaches in the past and you do not have any features such as numbness of the face, vision loss, confusion with headaches, I will be fine refilling sumatriptan. It looks like you had been 100 mg.     I will await to hear from you.

## 2018-09-14 DIAGNOSIS — Z8481 Family history of carrier of genetic disease: Secondary | ICD-10-CM | POA: Diagnosis not present

## 2018-09-14 DIAGNOSIS — Z803 Family history of malignant neoplasm of breast: Secondary | ICD-10-CM | POA: Diagnosis not present

## 2018-09-21 DIAGNOSIS — D225 Melanocytic nevi of trunk: Secondary | ICD-10-CM | POA: Diagnosis not present

## 2018-09-21 DIAGNOSIS — D2271 Melanocytic nevi of right lower limb, including hip: Secondary | ICD-10-CM | POA: Diagnosis not present

## 2018-09-21 DIAGNOSIS — D2261 Melanocytic nevi of right upper limb, including shoulder: Secondary | ICD-10-CM | POA: Diagnosis not present

## 2018-09-21 DIAGNOSIS — D2262 Melanocytic nevi of left upper limb, including shoulder: Secondary | ICD-10-CM | POA: Diagnosis not present

## 2018-09-21 DIAGNOSIS — D2272 Melanocytic nevi of left lower limb, including hip: Secondary | ICD-10-CM | POA: Diagnosis not present

## 2018-09-21 DIAGNOSIS — L918 Other hypertrophic disorders of the skin: Secondary | ICD-10-CM | POA: Diagnosis not present

## 2018-09-29 ENCOUNTER — Ambulatory Visit: Payer: Self-pay | Admitting: Licensed Clinical Social Worker

## 2018-09-29 ENCOUNTER — Telehealth: Payer: Self-pay | Admitting: Licensed Clinical Social Worker

## 2018-09-29 ENCOUNTER — Encounter: Payer: Self-pay | Admitting: Licensed Clinical Social Worker

## 2018-09-29 DIAGNOSIS — Z8371 Family history of colonic polyps: Secondary | ICD-10-CM

## 2018-09-29 DIAGNOSIS — Z8481 Family history of carrier of genetic disease: Secondary | ICD-10-CM

## 2018-09-29 DIAGNOSIS — Z800 Family history of malignant neoplasm of digestive organs: Secondary | ICD-10-CM

## 2018-09-29 DIAGNOSIS — Z803 Family history of malignant neoplasm of breast: Secondary | ICD-10-CM

## 2018-09-29 DIAGNOSIS — Z1379 Encounter for other screening for genetic and chromosomal anomalies: Secondary | ICD-10-CM | POA: Insufficient documentation

## 2018-09-29 NOTE — Progress Notes (Signed)
HPI:  Kara Vaughn was previously seen in the [facility] clinic due to a family history of cancer, family history of BRCA2+ and PMS2+ and concerns regarding a hereditary predisposition to cancer. Please refer to our prior cancer genetics clinic note for more information regarding our discussion, assessment and recommendations, at the time. Kara Vaughn recent genetic test results were disclosed to her, as were recommendations warranted by these results. These results and recommendations are discussed in more detail below.  CANCER HISTORY:   No history exists.    FAMILY HISTORY:  We obtained a detailed, 4-generation family history.  Significant diagnoses are listed below: Family History  Problem Relation Age of Onset  . Thyroid disease Father   . Heart disease Father        2011, MI  . Colon polyps Father   . Thyroid disease Sister   . Cancer Maternal Aunt 60       colon cancer  . Cancer Paternal Aunt        esophageal  . COPD Maternal Grandfather   . Cancer Paternal Grandmother 33       ?uterine cancer, breast cancer, bone cancer  . Breast cancer Paternal Grandmother 54  . BRCA 1/2 Cousin        BRCA2+  . Other Cousin        Lynch syndrome (PMS2+)   Kara Vaughn has 2 daughters: Kara Vaughn, age 39 and Kara Vaughn age 39. She also has one son, Kara Vaughn age 39. Kara Vaughn also has a sister, Kara Vaughn, who is 39 and reportedly recently had genetic testing done through [facility]. Kara Vaughn does not have children.  Kara Vaughn mother is living at 39 with no history of cancer. The patient has 5 maternal uncles, 4 maternal aunts. One of her aunts had colon cancer and is living at 60. No cancers in other aunts/uncles, and no known cancers in her maternal cousins. Her maternal grandmother is living at 39, her grandfather died at 27 and had emphysema.   Kara Vaughn's father is living at 86 and has history of colon polyps, she believes he has several every time he goes for a  colonoscopy. He has colonoscopies every 3 years. He has a history of smoking. Kara Vaughn has one full paternal aunt who is living at 64, no cancer. She has a paternal half aunt through her paternal grandmother. This half aunt had esophageal cancer at 88, died at 41, and had history of smoking. This half aunt also had a daughter, Kara Vaughn (patient's cousin) who had genetic testing and tested positive for both BRCA2 and PMS2, report was available for review. The patient's paternal grandmother had breast cancer at 19, possibly had a uterine cancer but patient is unsure, and reportedly had bone cancer at 57. The patient believes this bone cancer was a new primary and not recurrence. Paternal grandfather died at 76.   Kara Vaughn is aware of previous family history of genetic testing for hereditary cancer risks. Patient's maternal ancestors are of Caucasian descent, and paternal ancestors are of [place] descent. There is no reported Ashkenazi Jewish ancestry. There is no known consanguinity.   GENETIC TEST RESULTS: Genetic testing reported out on 09/29/2018 through the [facility] Multi cancer panel found no pathogenic mutations.  The Multi-Cancer Panel offered by [facility] includes sequencing and/or deletion duplication testing of the following 85 genes: AIP, ALK, APC, ATM, AXIN2,BAP1,  BARD1, BLM, BMPR1A, BRCA1, BRCA2, BRIP1, CASR, CDC73, CDH1, CDK4, CDKN1B, CDKN1C, CDKN2A (p14ARF), CDKN2A (p16INK4a), CEBPA, CHEK2,  CTNNA1, DICER1, DIS3L2, EGFR (c.2369C>T, p.Thr790Met variant only), EPCAM (Deletion/duplication testing only), FH, FLCN, GATA2, GPC3, GREM1 (Promoter region deletion/duplication testing only), HOXB13 (c.251G>A, p.Gly84Glu), HRAS, KIT, MAX, MEN1, MET, MITF (c.952G>A, p.Glu318Lys variant only), MLH1, MSH2, MSH3, MSH6, MUTYH, NBN, NF1, NF2, NTHL1, PALB2, PDGFRA, PHOX2B, PMS2, POLD1, POLE, POT1, PRKAR1A, PTCH1, PTEN, RAD50, RAD51C, RAD51D, RB1, RECQL4, RET, RNF43, RUNX1, SDHAF2, SDHA (sequence changes only),  SDHB, SDHC, SDHD, SMAD4, SMARCA4, SMARCB1, SMARCE1, STK11, SUFU, TERC, TERT, TMEM127, TP53, TSC1, TSC2, VHL, WRN and WT1.   The test report has been scanned into EPIC and is located under the Molecular Pathology section of the Results Review tab.  A portion of the result report is included below for reference.     We recommended Kara Vaughn pursue testing for the familial hereditary cancer gene mutations in BRCA2 and PMS2. Kara Vaughn's test was normal and did not reveal the familial mutations. We call this result a true negative result because the cancer-causing mutation was identified in Kara Vaughn's family, and she did not inherit it.  Given this negative result, Kara Vaughn chances of developing BRCA2 and PMS2-related cancers are the same as they are in the general population.    ADDITIONAL GENETIC TESTING: We discussed with Kara Vaughn that her genetic testing was fairly extensive.  If there are genes identified to increase cancer risk that can be analyzed in the future, we would be happy to discuss and coordinate this testing at that time.    CANCER SCREENING RECOMMENDATIONS: Kara Vaughn test result is considered negative (normal).  This means that we have not identified the known hereditary cause for the family history of cancers in her, and we have also not identified any other hereditary causes for cancer.  While reassuring, this does not definitively rule out a hereditary predisposition to cancer. It is still possible that there could be genetic mutations that are undetectable by current technology. There could be genetic mutations in genes that have not been tested or identified to increase cancer risk.  Therefore, it is recommended she continue to follow the cancer management and screening guidelines provided by her  primary healthcare provider.   An individual's cancer risk and medical management are not determined by genetic test results alone. Overall cancer risk  assessment incorporates additional factors, including personal medical history, family history, and any available genetic information that may result in a personalized plan for cancer prevention and surveillance  Based on Kara Vaughn's personal and family history of cancer, as well as her genetic test results, statistical models Harriett Rush)  was used to estimate her risk of developing breast cancer. These estimate her lifetime risk of developing breast cancer to be approximately 15.3%. This may be an overestimate due to the fact that this model does not allow for unaffected cousins, therefore we could not note the presence of the known BRCA2 mutation. The patient's lifetime breast cancer risk is a preliminary estimate based on available information using one of several models endorsed by the [place] (ACS). The ACS recommends consideration of breast MRI screening as an adjunct to mammography for patients at high risk (defined as 20% or greater lifetime risk). A more detailed breast cancer risk assessment can be considered, if clinically indicated.     RECOMMENDATIONS FOR FAMILY MEMBERS:  Relatives in this family might be at some increased risk of developing cancer, over the general population risk, simply due to the family history of cancer.  We recommended female relatives in this family have a  yearly mammogram beginning at age 26, or 65 years younger than the earliest onset of cancer, an annual clinical breast exam, and perform monthly breast self-exams. Female relatives in this family should also have a gynecological exam as recommended by their primary provider. All family members should have a colonoscopy by age 72, or as directed by their physicians.  Based on Kara Vaughn's family history, we recommended her paternal relatives have genetic counseling and testing. Kara Vaughn will let us know if we can be of any assistance in coordinating genetic counseling and/or testing for  these family members.  FOLLOW-UP: Lastly, we discussed with Kara Vaughn that cancer genetics is a rapidly advancing field and it is possible that new genetic tests will be appropriate for her and/or her family members in the future. We encouraged her to remain in contact with cancer genetics on an annual basis so we can update her personal and family histories and let her know of advances in cancer genetics that may benefit this family.   Our contact number was provided. Kara Vaughn questions were answered to her satisfaction, and she knows she is welcome to call us at anytime with additional questions or concerns.   Faith Rogue, MS Genetic Counselor [email or web address] Phone: [phone]

## 2018-09-29 NOTE — Telephone Encounter (Signed)
Revealed negative genetic testing. We did not find the known familial mutations in her. This normal result is reassuring. It is unlikely that there is an increased risk of  cancer due to a mutation in one of these genes.  However, genetic testing is not perfect, and cannot definitively rule out a hereditary cause.  It will be important for her to keep in contact with genetics to learn if any additional testing may be needed in the future.

## 2018-10-02 ENCOUNTER — Ambulatory Visit
Admission: RE | Admit: 2018-10-02 | Discharge: 2018-10-02 | Disposition: A | Discharge status: Home / Self Care | Payer: 59 | Source: Ambulatory Visit | Attending: Family | Admitting: Family

## 2018-10-02 ENCOUNTER — Other Ambulatory Visit: Payer: Self-pay

## 2018-10-02 ENCOUNTER — Other Ambulatory Visit: Payer: Self-pay | Admitting: Family

## 2018-10-02 DIAGNOSIS — R921 Mammographic calcification found on diagnostic imaging of breast: Secondary | ICD-10-CM

## 2018-10-02 DIAGNOSIS — N6310 Unspecified lump in the right breast, unspecified quadrant: Secondary | ICD-10-CM

## 2018-10-02 DIAGNOSIS — N6312 Unspecified lump in the right breast, upper inner quadrant: Secondary | ICD-10-CM | POA: Diagnosis not present

## 2018-10-02 DIAGNOSIS — R928 Other abnormal and inconclusive findings on diagnostic imaging of breast: Secondary | ICD-10-CM

## 2018-10-04 ENCOUNTER — Encounter: Payer: Self-pay | Admitting: Family

## 2019-02-14 DIAGNOSIS — Z03818 Encounter for observation for suspected exposure to other biological agents ruled out: Secondary | ICD-10-CM | POA: Diagnosis not present

## 2019-04-04 ENCOUNTER — Other Ambulatory Visit: Payer: Self-pay | Admitting: Family

## 2019-04-04 ENCOUNTER — Ambulatory Visit
Admission: RE | Admit: 2019-04-04 | Discharge: 2019-04-04 | Disposition: A | Discharge status: Home / Self Care | Payer: 59 | Source: Ambulatory Visit | Attending: Family | Admitting: Family

## 2019-04-04 DIAGNOSIS — N6320 Unspecified lump in the left breast, unspecified quadrant: Secondary | ICD-10-CM

## 2019-04-04 DIAGNOSIS — R928 Other abnormal and inconclusive findings on diagnostic imaging of breast: Secondary | ICD-10-CM

## 2019-04-04 DIAGNOSIS — N6323 Unspecified lump in the left breast, lower outer quadrant: Secondary | ICD-10-CM | POA: Diagnosis not present

## 2019-04-04 DIAGNOSIS — N6312 Unspecified lump in the right breast, upper inner quadrant: Secondary | ICD-10-CM | POA: Diagnosis not present

## 2019-04-04 DIAGNOSIS — R921 Mammographic calcification found on diagnostic imaging of breast: Secondary | ICD-10-CM | POA: Diagnosis not present

## 2019-04-05 ENCOUNTER — Other Ambulatory Visit: Payer: Self-pay | Admitting: Internal Medicine

## 2019-04-05 DIAGNOSIS — R928 Other abnormal and inconclusive findings on diagnostic imaging of breast: Secondary | ICD-10-CM

## 2019-06-10 IMAGING — US ULTRASOUND RIGHT BREAST LIMITED
1 series · 13 of 21 positions shown · non-contrast
Comparison: 10/17/2017 and earlier

CLINICAL DATA: First six-month follow-up for probably benign LEFT
breast calcifications and right breast masses.

[Series 1: ultrasound right breast limited · 0.07mm/px · 13 of 21 slices shown]
[im 1/21]
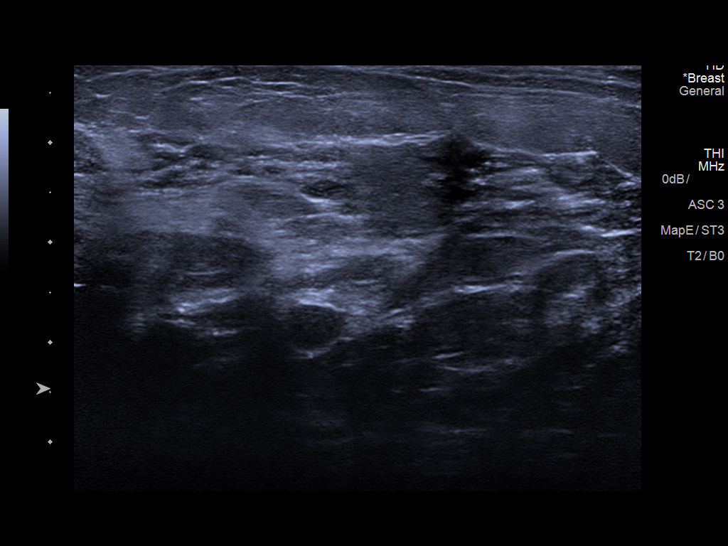
[im 3/21]
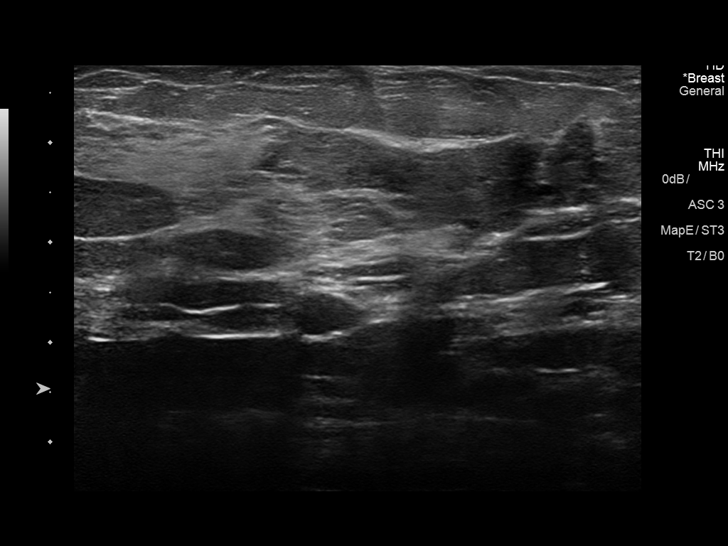
[im 5/21]
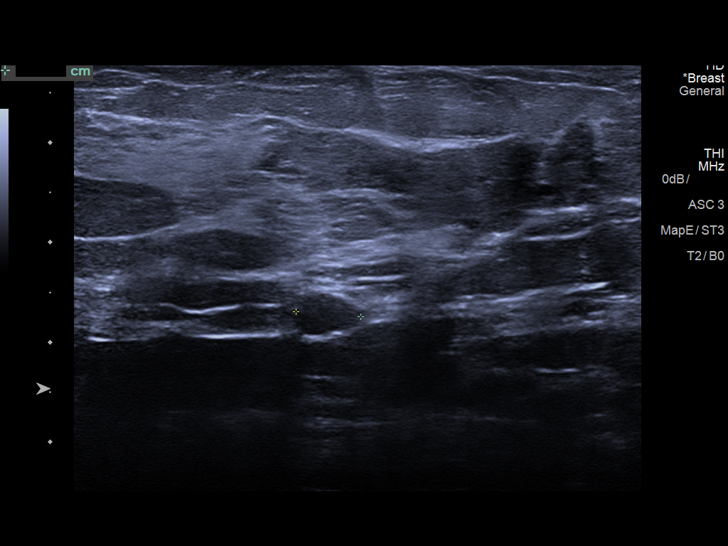
[im 6/21]
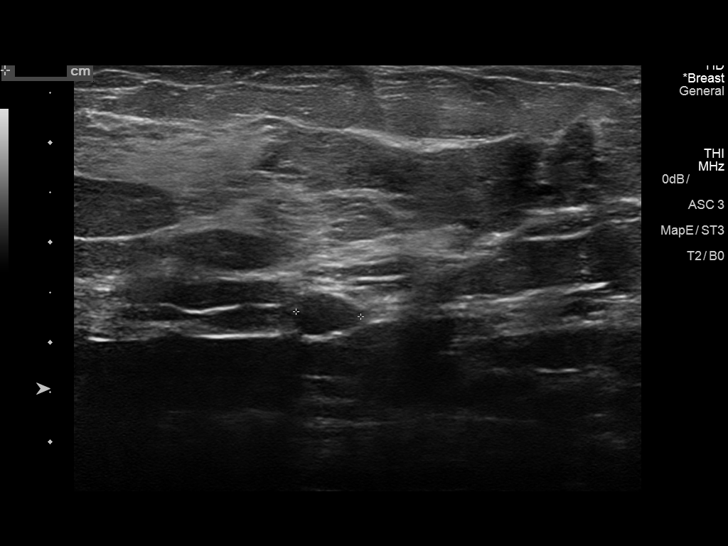
[im 8/21]
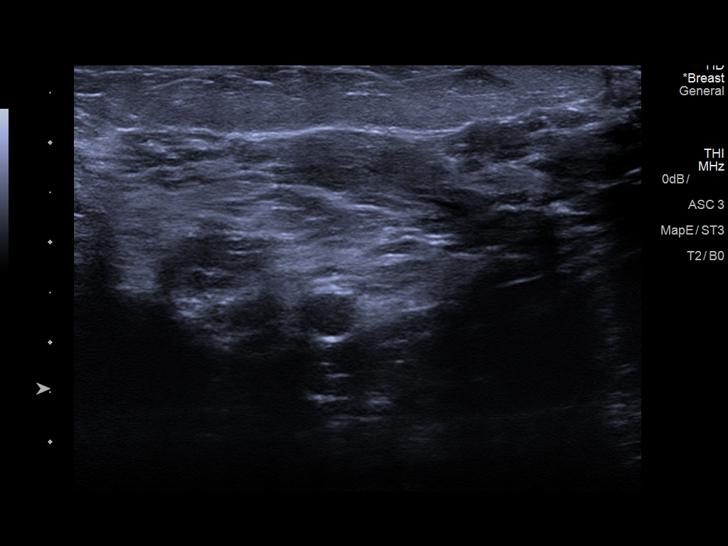
[im 9/21]
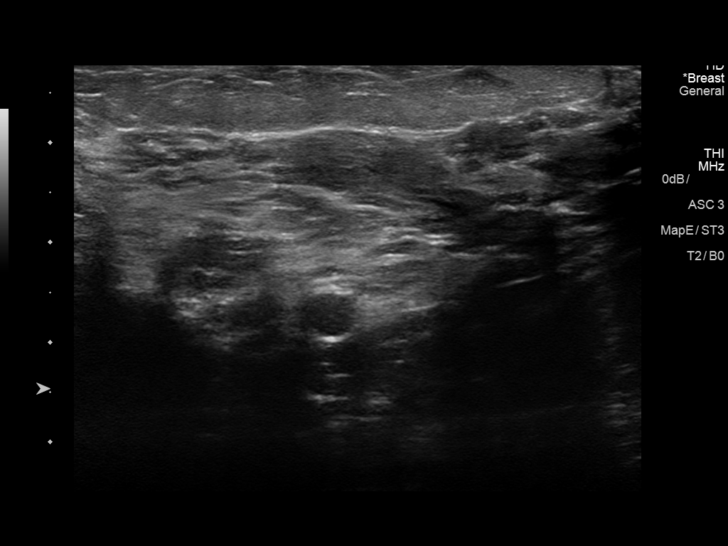
[im 11/21]
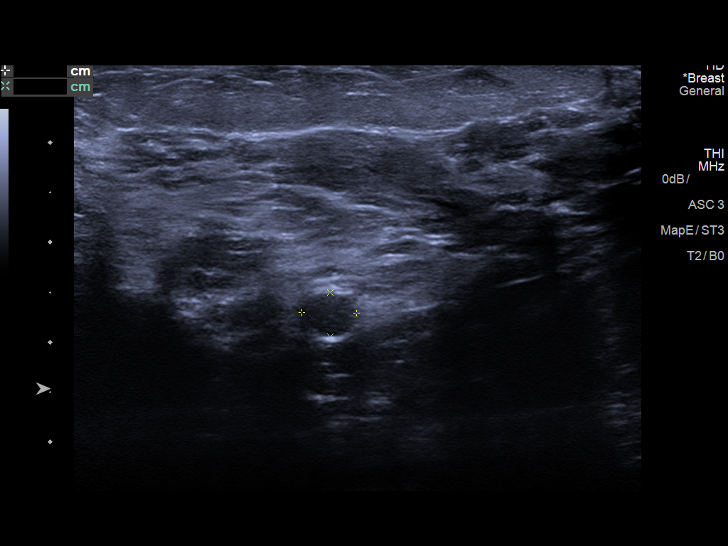
[im 13/21]
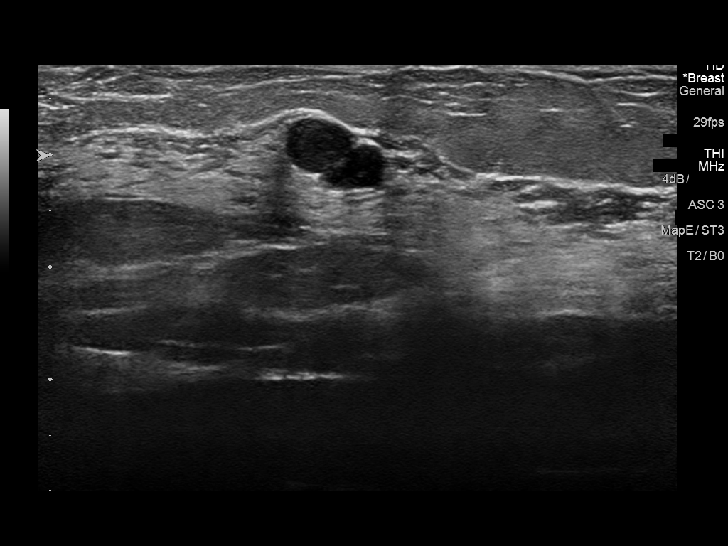
[im 14/21]
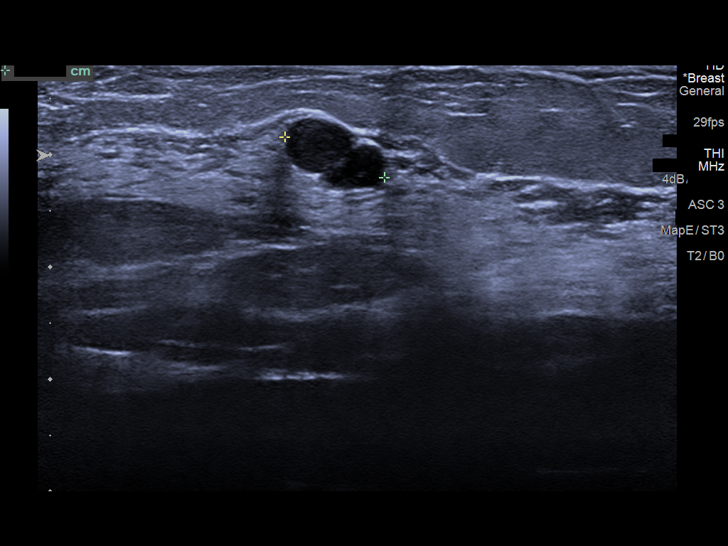
[im 16/21]
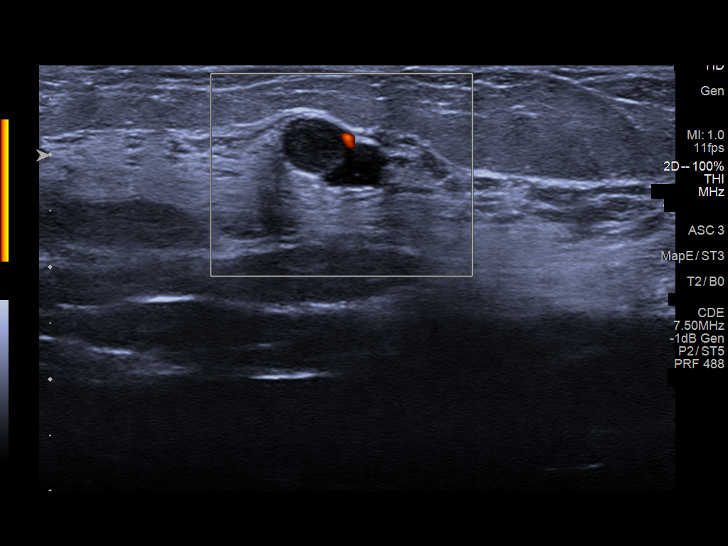
[im 17/21]
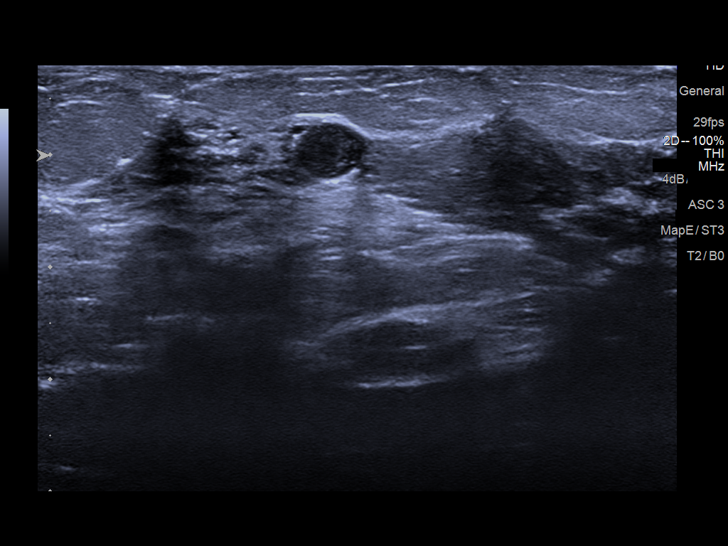
[im 19/21]
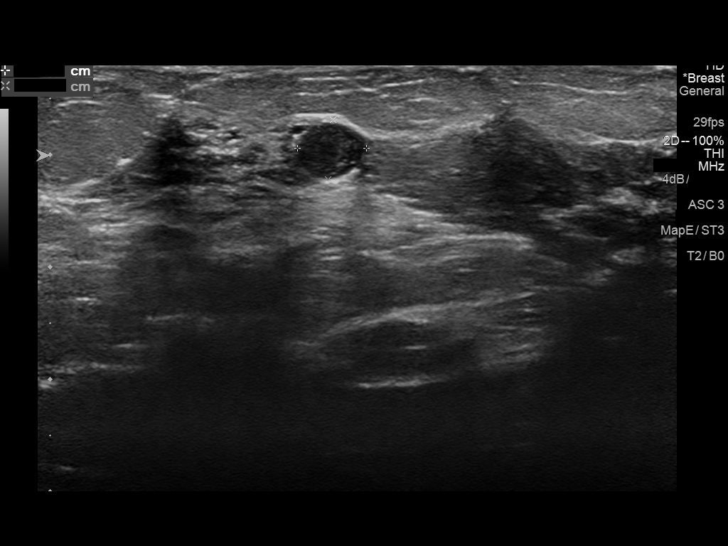
[im 21/21]
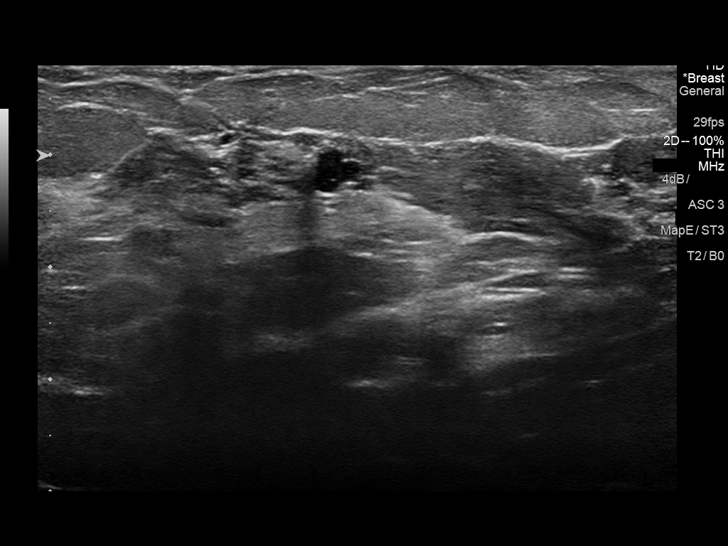

[13 of 21 positions shown; findings below may reference images not displayed]

At the time of baseline mammogram, it was noted that the patient had
numerous punctate and amorphous calcifications in the UPPER-OUTER
QUADRANT of the LEFT breast and the UPPER INNER QUADRANT of the
RIGHT breast. Patient underwent stereotactic guided core biopsy of
the RIGHT breast calcifications which demonstrated pseudoangiomatous
stromal hyperplasia and calcifications associated with benign breast
lobules. As the calcifications in the LEFT breast were similar to
those on the RIGHT, it was recommended that patient have six-month
follow-up of LEFT breast calcifications.

Additionally, 2 probably benign masses were identified in the RIGHT
breast, in the 12:30 o'clock and 1 o'clock locations for which
ultrasound follow-up was recommended.

EXAM:
DIGITAL DIAGNOSTIC LEFT MAMMOGRAM WITH CAD AND TOMO

ULTRASOUND RIGHT BREAST
ACR Breast Density Category c: The breast tissue is heterogeneously
dense, which may obscure small masses.
FINDINGS: Right breast:

Ultrasound: Targeted ultrasound is performed, showing a hypoechoic
oval circumscribed parallel mass in the retroareolar region of the
RIGHT breast, 1 o'clock location 1 centimeter from the nipple
measuring 0.7 x 0.6 x 0.4 centimeters. In the 12:30 o'clock location
1 centimeter from the nipple, a bilobed hypoechoic lesion measures
1.0 x 0.6 x 0.5 centimeters. No acoustic shadowing or suspicious
abnormalities are identified sonographically.

Left breast:

Mammogram: Magnified views are performed of calcifications in the
UPPER-OUTER QUADRANT of the LEFT breast. On magnified views,
calcifications are punctate and amorphous, similar in appearance to
the previous exam and similar to the RIGHT breast calcifications
which were benign by stereotactic guided core biopsy. No suspicious
morphology or distribution of calcifications identified.
Mammographic images were processed with CAD.
IMPRESSION: 1. Stable appearance of probably benign masses in the 12 30 and 1
o'clock locations of the RIGHT breast.
2. Stable appearance of probably benign LEFT breast calcifications.

RECOMMENDATION:
Bilateral diagnostic mammogram and RIGHT breast ultrasound are
recommended in 6 months to assess stability of findings bilaterally.

I have discussed the findings and recommendations with the patient.
Results were also provided in writing at the conclusion of the
visit. If applicable, a reminder letter will be sent to the patient
regarding the next appointment.

BI-RADS CATEGORY  3: Probably benign.

## 2019-07-25 ENCOUNTER — Other Ambulatory Visit (HOSPITAL_COMMUNITY)
Admission: RE | Admit: 2019-07-25 | Discharge: 2019-07-25 | Disposition: A | Discharge status: Home / Self Care | Payer: 59 | Source: Ambulatory Visit | Attending: Family | Admitting: Family

## 2019-07-25 ENCOUNTER — Ambulatory Visit (INDEPENDENT_AMBULATORY_CARE_PROVIDER_SITE_OTHER): Payer: 59 | Admitting: Family

## 2019-07-25 ENCOUNTER — Encounter: Payer: Self-pay | Admitting: Family

## 2019-07-25 ENCOUNTER — Other Ambulatory Visit: Payer: Self-pay

## 2019-07-25 VITALS — BP 110/70 | HR 73 | Temp 96.5°F | Resp 15 | Ht 68.75 in | Wt 243.2 lb

## 2019-07-25 DIAGNOSIS — Z0000 Encounter for general adult medical examination without abnormal findings: Secondary | ICD-10-CM | POA: Insufficient documentation

## 2019-07-25 DIAGNOSIS — G43009 Migraine without aura, not intractable, without status migrainosus: Secondary | ICD-10-CM

## 2019-07-25 LAB — CBC WITH DIFFERENTIAL/PLATELET
Basophils Absolute: 0.1 10*3/uL (ref 0.0–0.1)
Basophils Relative: 1.1 % (ref 0.0–3.0)
Eosinophils Absolute: 0.2 10*3/uL (ref 0.0–0.7)
Eosinophils Relative: 3.2 % (ref 0.0–5.0)
HCT: 42.5 % (ref 36.0–46.0)
Hemoglobin: 14.4 g/dL (ref 12.0–15.0)
Lymphocytes Relative: 21.9 % (ref 12.0–46.0)
Lymphs Abs: 1.7 10*3/uL (ref 0.7–4.0)
MCHC: 34 g/dL (ref 30.0–36.0)
MCV: 88.3 fl (ref 78.0–100.0)
Monocytes Absolute: 0.6 10*3/uL (ref 0.1–1.0)
Monocytes Relative: 8.3 % (ref 3.0–12.0)
Neutro Abs: 5 10*3/uL (ref 1.4–7.7)
Neutrophils Relative %: 65.5 % (ref 43.0–77.0)
Platelets: 256 10*3/uL (ref 150.0–400.0)
RBC: 4.81 Mil/uL (ref 3.87–5.11)
RDW: 13.4 % (ref 11.5–15.5)
WBC: 7.7 10*3/uL (ref 4.0–10.5)

## 2019-07-25 LAB — COMPREHENSIVE METABOLIC PANEL
ALT: 13 U/L (ref 0–35)
AST: 16 U/L (ref 0–37)
Albumin: 4.1 g/dL (ref 3.5–5.2)
Alkaline Phosphatase: 45 U/L (ref 39–117)
BUN: 9 mg/dL (ref 6–23)
CO2: 29 mEq/L (ref 19–32)
Calcium: 8.8 mg/dL (ref 8.4–10.5)
Chloride: 103 mEq/L (ref 96–112)
Creatinine, Ser: 0.64 mg/dL (ref 0.40–1.20)
GFR: 103 mL/min (ref >60.00)
Glucose, Bld: 88 mg/dL (ref 70–99)
Potassium: 3.9 mEq/L (ref 3.5–5.1)
Sodium: 138 mEq/L (ref 135–145)
Total Bilirubin: 0.6 mg/dL (ref 0.2–1.2)
Total Protein: 6.2 g/dL (ref 6.0–8.3)

## 2019-07-25 LAB — TSH: TSH: 1.79 u[IU]/mL (ref 0.35–4.50)

## 2019-07-25 LAB — LIPID PANEL
Cholesterol: 136 mg/dL (ref 0–200)
HDL: 51.3 mg/dL (ref >39.00)
LDL Cholesterol: 70 mg/dL (ref 0–99)
NonHDL: 84.31
Total CHOL/HDL Ratio: 3
Triglycerides: 71 mg/dL (ref 0.0–149.0)
VLDL: 14.2 mg/dL (ref 0.0–40.0)

## 2019-07-25 LAB — VITAMIN D 25 HYDROXY (VIT D DEFICIENCY, FRACTURES): VITD: 16.09 ng/mL — ABNORMAL LOW (ref 30.00–100.00)

## 2019-07-25 LAB — HEMOGLOBIN A1C: Hgb A1c MFr Bld: 5.5 % (ref 4.6–6.5)

## 2019-07-25 NOTE — Patient Instructions (Addendum)
Bilateral diagnostic Mammogram and Bilateral breast Ultrasound in 6 months ( June 2021). Please ensure you schedule this and let me know of ANY concerns.   Please contact genetic counselor to ensure that you have been adequately tested for your risk for  colon cancer and when to start screening you,  specifically as it pertains to Lynch syndrome as we discussed regarding your paternal cousin.   Faith Rogue, MS Genetic Counselor [email or web address] Phone: [phone]    Stay safe!   Health Maintenance, Female Adopting a healthy lifestyle and getting preventive care are important in promoting health and wellness. Ask your health care provider about:  The right schedule for you to have regular tests and exams.  Things you can do on your own to prevent diseases and keep yourself healthy. What should I know about diet, weight, and exercise? Eat a healthy diet   Eat a diet that includes plenty of vegetables, fruits, low-fat dairy products, and lean protein.  Do not eat a lot of foods that are high in solid fats, added sugars, or sodium. Maintain a healthy weight Body mass index (BMI) is used to identify weight problems. It estimates body fat based on height and weight. Your health care provider can help determine your BMI and help you achieve or maintain a healthy weight. Get regular exercise Get regular exercise. This is one of the most important things you can do for your health. Most adults should:  Exercise for at least 150 minutes each week. The exercise should increase your heart rate and make you sweat (moderate-intensity exercise).  Do strengthening exercises at least twice a week. This is in addition to the moderate-intensity exercise.  Spend less time sitting. Even light physical activity can be beneficial. Watch cholesterol and blood lipids Have your blood tested for lipids and cholesterol at 40 years of age, then have this test every 5 years. Have your  cholesterol levels checked more often if:  Your lipid or cholesterol levels are high.  You are older than 40 years of age.  You are at high risk for heart disease. What should I know about cancer screening? Depending on your health history and family history, you may need to have cancer screening at various ages. This may include screening for:  Breast cancer.  Cervical cancer.  Colorectal cancer.  Skin cancer.  Lung cancer. What should I know about heart disease, diabetes, and high blood pressure? Blood pressure and heart disease  High blood pressure causes heart disease and increases the risk of stroke. This is more likely to develop in people who have high blood pressure readings, are of African descent, or are overweight.  Have your blood pressure checked: ? Every 3-5 years if you are 40-38 years of age. ? Every year if you are 1 years old or older. Diabetes Have regular diabetes screenings. This checks your fasting blood sugar level. Have the screening done:  Once every three years after age 87 if you are at a normal weight and have a low risk for diabetes.  More often and at a younger age if you are overweight or have a high risk for diabetes. What should I know about preventing infection? Hepatitis B If you have a higher risk for hepatitis B, you should be screened for this virus. Talk with your health care provider to find out if you are at risk for hepatitis B infection. Hepatitis C Testing is recommended for:  Everyone born from [birth year] through 1966.  Anyone with known risk  factors for hepatitis C. Sexually transmitted infections (STIs)  Get screened for STIs, including gonorrhea and chlamydia, if: ? You are sexually active and are younger than 40 years of age. ? You are older than 40 years of age and your health care provider tells you that you are at risk for this type of infection. ? Your sexual activity has changed since you were last screened, and you are  at increased risk for chlamydia or gonorrhea. Ask your health care provider if you are at risk.  Ask your health care provider about whether you are at high risk for HIV. Your health care provider may recommend a prescription medicine to help prevent HIV infection. If you choose to take medicine to prevent HIV, you should first get tested for HIV. You should then be tested every 3 months for as long as you are taking the medicine. Pregnancy  If you are about to stop having your period (premenopausal) and you may become pregnant, seek counseling before you get pregnant.  Take 400 to 800 micrograms (mcg) of folic acid every day if you become pregnant.  Ask for birth control (contraception) if you want to prevent pregnancy. Osteoporosis and menopause Osteoporosis is a disease in which the bones lose minerals and strength with aging. This can result in bone fractures. If you are 73 years old or older, or if you are at risk for osteoporosis and fractures, ask your health care provider if you should:  Be screened for bone loss.  Take a calcium or vitamin D supplement to lower your risk of fractures.  Be given hormone replacement therapy (HRT) to treat symptoms of menopause. Follow these instructions at home: Lifestyle  Do not use any products that contain nicotine or tobacco, such as cigarettes, e-cigarettes, and chewing tobacco. If you need help quitting, ask your health care provider.  Do not use street drugs.  Do not share needles.  Ask your health care provider for help if you need support or information about quitting drugs. Alcohol use  Do not drink alcohol if: ? Your health care provider tells you not to drink. ? You are pregnant, may be pregnant, or are planning to become pregnant.  If you drink alcohol: ? Limit how much you use to 0-1 drink a day. ? Limit intake if you are breastfeeding.  Be aware of how much alcohol is in your drink. In the [place] one drink equals one 12 oz  bottle of beer (355 mL), one 5 oz glass of wine (148 mL), or one 1 oz glass of hard liquor (44 mL). General instructions  Schedule regular health, dental, and eye exams.  Stay current with your vaccines.  Tell your health care provider if: ? You often feel depressed. ? You have ever been abused or do not feel safe at home. Summary  Adopting a healthy lifestyle and getting preventive care are important in promoting health and wellness.  Follow your health care provider's instructions about healthy diet, exercising, and getting tested or screened for diseases.  Follow your health care provider's instructions on monitoring your cholesterol and blood pressure. This information is not intended to replace advice given to you by your health care provider. Make sure you discuss any questions you have with your health care provider. Document Revised: 04/12/2018 Document Reviewed: 04/12/2018 Elsevier Patient Education  2020 [employer].

## 2019-07-25 NOTE — Progress Notes (Signed)
Subjective:    Patient ID: Kara Vaughn, female    DOB: February 01, 1981, 39 y.o.   MRN: [id number]  CC: Kara Vaughn is a 39 y.o. female who presents today for physical exam.    HPI:  Feels well today NO complaints.   Migraine- feels similar to prior HA. HA is once or twice a year. Imitrex '100mg'$  x one works well.  Triggered by allergies, perfumes.  NO vision loss, numbness to face   Colorectal Cancer Screening: No early family history. Cousin with lynch syndrome. She will reach out to genetics in regards to this.  Breast Cancer Screening: Calcifications seen both breasts 04/2019. BL diagnostic Mammogram and BL breast US in 6 months.   Cervical Cancer Screening: Done in 2019; would like done again today Bone Health screening/DEXA for 65+: No increased fracture risk. Defer screening at this time. Lung Cancer Screening: Doesn't have 30 year pack year history and age > 52 years.        Tetanus - utd        Labs: Screening labs today. Exercise: Gets regular exercise.  Alcohol use: rare Smoking/tobacco use: Nonsmoker.  Wears seat belt: Yes.  HISTORY:  Past Medical History:  Diagnosis Date  . Family history of BRCA2 gene positive   . Family history of breast cancer   . Family history of colon cancer   . Family history of colonic polyps   . Family history of esophageal cancer   . Family history of Lynch syndrome   . HPV test positive 2001  . Migraine     Past Surgical History:  Procedure Laterality Date  . BREAST BIOPSY Right 10/17/2017   pseudoangiomatous stromal hyperplasia   . CYST REMOVAL TRUNK  07/2012   [facility]  . VAGINAL DELIVERY     3   Family History  Problem Relation Age of Onset  . Thyroid disease Father   . Heart disease Father        2011, MI  . Colon polyps Father   . Thyroid disease Sister   . Cancer Maternal Aunt 60       colon cancer  . Cancer Paternal Aunt        esophageal  . COPD Maternal Grandfather   . Cancer Paternal Grandmother  63       ?uterine cancer, breast cancer, bone cancer  . Breast cancer Paternal Grandmother 44  . BRCA 1/2 Cousin        BRCA2+  . Other Cousin        Lynch syndrome (PMS2+)      ALLERGIES: Patient has no known allergies.  Current Outpatient Medications on File Prior to Visit  Medication Sig Dispense Refill  . levonorgestrel (MIRENA) 20 MCG/24HR IUD 1 each by Intrauterine route once.    . Multiple Vitamin (MULTIVITAMIN) tablet Take 1 tablet by mouth daily.    Marland Kitchen EPINEPHrine 0.3 mg/0.3 mL IJ SOAJ injection Inject 0.3 mLs (0.3 mg total) into the muscle once. (Patient not taking: Reported on 07/21/2018) 2 Device 1  . fluticasone (FLONASE) 50 MCG/ACT nasal spray Place 2 sprays into both nostrils daily. (Patient not taking: Reported on 07/25/2019) 16 g 6  . SUMAtriptan (IMITREX) 100 MG tablet Take 1 tablet (100 mg total) by mouth once for 1 dose. May repeat dose after 2 hours if headache persists or recurs. 10 tablet 0   No current facility-administered medications on file prior to visit.    Social History   Tobacco Use  .  Smoking status: Never Smoker  . Smokeless tobacco: Never Used  Substance Use Topics  . Alcohol use: Yes    Comment: Rarely  . Drug use: No    Review of Systems  Constitutional: Negative for chills, fever and unexpected weight change.  HENT: Negative for congestion.   Respiratory: Negative for cough.   Cardiovascular: Negative for chest pain, palpitations and leg swelling.  Gastrointestinal: Negative for nausea and vomiting.  Musculoskeletal: Negative for arthralgias and myalgias.  Skin: Negative for rash.  Neurological: Negative for headaches (very rare).  Hematological: Negative for adenopathy.  Psychiatric/Behavioral: Negative for confusion.      Objective:    BP 110/70 (BP Location: Left Arm, Patient Position: Sitting, Cuff Size: Normal)   Pulse 73   Temp (!) 96.5 F (35.8 C) (Temporal)   Resp 15   Ht 5' 8.75" (1.746 m)   Wt 243 lb 3.2 oz (110.3 kg)    SpO2 98%   BMI 36.18 kg/m   BP Readings from Last 3 Encounters:  07/25/19 110/70  07/21/18 102/60  09/28/17 112/64   Wt Readings from Last 3 Encounters:  07/25/19 243 lb 3.2 oz (110.3 kg)  07/21/18 239 lb 9.6 oz (108.7 kg)  09/28/17 244 lb 6 oz (110.8 kg)    Physical Exam Vitals reviewed.  Constitutional:      Appearance: She is well-developed.  Eyes:     Conjunctiva/sclera: Conjunctivae normal.  Neck:     Thyroid: No thyroid mass or thyromegaly.  Cardiovascular:     Rate and Rhythm: Normal rate and regular rhythm.     Pulses: Normal pulses.     Heart sounds: Normal heart sounds.  Pulmonary:     Effort: Pulmonary effort is normal.     Breath sounds: Normal breath sounds. No wheezing, rhonchi or rales.  Chest:     Breasts: Breasts are symmetrical.        Right: No inverted nipple, mass, nipple discharge, skin change or tenderness.        Left: No inverted nipple, mass, nipple discharge, skin change or tenderness.  Genitourinary:    Cervix: No cervical motion tenderness, discharge or friability.     Uterus: Not enlarged, not fixed and not tender.      Adnexa:        Right: No mass, tenderness or fullness.         Left: No mass, tenderness or fullness.       Comments: Pap performed. No CMT. Unable to appreciated ovaries. Lymphadenopathy:     Head:     Right side of head: No submental, submandibular, tonsillar, preauricular, posterior auricular or occipital adenopathy.     Left side of head: No submental, submandibular, tonsillar, preauricular, posterior auricular or occipital adenopathy.     Cervical:     Right cervical: No superficial, deep or posterior cervical adenopathy.    Left cervical: No superficial, deep or posterior cervical adenopathy.     Upper Body:     Right upper body: No pectoral adenopathy.     Left upper body: No pectoral adenopathy.  Skin:    General: Skin is warm and dry.  Neurological:     Mental Status: She is alert.  Psychiatric:         Speech: Speech normal.        Behavior: Behavior normal.        Thought Content: Thought content normal.        Assessment & Plan:   Problem List Items Addressed  This Visit      Cardiovascular and Mediastinum   Migraine    Stable, rare.  Counseled patient that if  headache presentation, frequency or severity were to change in any way, she will let me know.        Other   Routine general medical examination at a health care facility - Primary    Clinical breast exam and Pap smear performed today.  She will ensure that her bilateral diagnostic mammogram and ultrasound is scheduled for June 2021. emphasized importance of patient to circle back with genetics in regards to family history, particularly of husband with Lynch syndrome.  Advised her to let me know if they felt she needed a colonoscopy and earlier age so that I can document in the chart.      Relevant Orders   TSH   CBC with Differential/Platelet   Comprehensive metabolic panel   Hemoglobin A1c   Lipid panel   VITAMIN D 25 Hydroxy (Vit-D Deficiency, Fractures)   Cytology - PAP       I am having Rayne Du. Vaughn maintain her multivitamin, EPINEPHrine, levonorgestrel, fluticasone, and SUMAtriptan.   No orders of the defined types were placed in this encounter.   Return precautions given.   Risks, benefits, and alternatives of the medications and treatment plan prescribed today were discussed, and patient expressed understanding.   Education regarding symptom management and diagnosis given to patient on AVS.   Continue to follow with Burnard Hawthorne, FNP for routine health maintenance.   Rayne Du Vaughn and I agreed with plan.   Mable Paris, FNP

## 2019-07-25 NOTE — Assessment & Plan Note (Signed)
Stable, rare.  Counseled patient that if  headache presentation, frequency or severity were to change in any way, she will let me know.

## 2019-07-25 NOTE — Assessment & Plan Note (Addendum)
Clinical breast exam and Pap smear performed today.  She will ensure that her bilateral diagnostic mammogram and ultrasound is scheduled for June 2021. emphasized importance of patient to circle back with genetics in regards to family history, particularly of husband with Lynch syndrome.  Advised her to let me know if they felt she needed a colonoscopy and earlier age so that I can document in the chart.

## 2019-07-27 ENCOUNTER — Encounter: Payer: Self-pay | Admitting: Family

## 2019-07-27 LAB — CYTOLOGY - PAP
Adequacy: ABNORMAL
Comment: NEGATIVE
High risk HPV: NEGATIVE

## 2019-08-30 ENCOUNTER — Telehealth: Payer: Self-pay | Admitting: Family

## 2019-08-30 NOTE — Telephone Encounter (Signed)
I left pt vm to call ofc to get pt avail time and date to sch mammo.

## 2019-09-02 ENCOUNTER — Encounter: Payer: Self-pay | Admitting: Family

## 2019-09-05 ENCOUNTER — Telehealth: Payer: Self-pay | Admitting: Family

## 2019-09-05 NOTE — Telephone Encounter (Signed)
Patient scheduled for VV on 5/17 to fill out paperwork & discuss.

## 2019-09-05 NOTE — Telephone Encounter (Signed)
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

## 2019-09-17 ENCOUNTER — Other Ambulatory Visit: Payer: Self-pay | Admitting: Family

## 2019-09-17 ENCOUNTER — Telehealth: Payer: 59 | Admitting: Family

## 2019-09-17 ENCOUNTER — Telehealth (INDEPENDENT_AMBULATORY_CARE_PROVIDER_SITE_OTHER): Payer: 59 | Admitting: Family

## 2019-09-17 ENCOUNTER — Encounter: Payer: Self-pay | Admitting: Family

## 2019-09-17 DIAGNOSIS — T783XXA Angioneurotic edema, initial encounter: Secondary | ICD-10-CM | POA: Diagnosis not present

## 2019-09-17 DIAGNOSIS — J069 Acute upper respiratory infection, unspecified: Secondary | ICD-10-CM

## 2019-09-17 MED ORDER — EPINEPHRINE 0.3 MG/0.3ML IJ SOAJ: 0.3000 mg | Freq: Once | INTRAMUSCULAR | 1 refills | Status: AC

## 2019-09-17 MED ORDER — FLUTICASONE PROPIONATE 50 MCG/ACT NA SUSP: 2.0000 | Freq: Every day | NASAL | 6 refills | Status: DC

## 2019-09-17 NOTE — Progress Notes (Signed)
Virtual Visit via Video Note  I connected with@  on 09/17/19 at  8:00 AM EDT by a video enabled telemedicine application and verified that I am speaking with the correct person using two identifiers.  Location patient: home Location provider:work  Persons participating in the virtual visit: patient, provider  I discussed the limitations of evaluation and management by telemedicine and the availability of in person appointments. The patient expressed understanding and agreed to proceed.   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 This is through [employer]. 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 Doesn't plan to do any hikes, trails.  Describes active lifestyle , including walks on trails, swimming, [employer] with kids and mows the lawn. During the activities, no CP, sob.   Angioedema- 5 years since last episode angioedema; never could figure out etiology. Happened 3-4 times. Saw an allergist.  No sob, cp  Mammogram scheduled.   ROS: See pertinent positives and negatives per HPI.  Past Medical History:  Diagnosis Date  . Family history of BRCA2 gene positive   . Family history of breast cancer   . Family history of colon cancer   . Family history of colonic polyps   . Family history of esophageal cancer   . Family history of Lynch syndrome   . HPV test positive 2001  . Migraine     Past Surgical History:  Procedure Laterality Date  . BREAST BIOPSY Right 10/17/2017   pseudoangiomatous stromal hyperplasia   . CYST REMOVAL TRUNK  07/2012   [facility]  . VAGINAL DELIVERY     3    Family History  Problem Relation Age of Onset  . Thyroid disease Father   . Heart disease Father        2011, MI  . Colon polyps Father   . Thyroid disease Sister   . Cancer Maternal Aunt 60       colon cancer  .  Cancer Paternal Aunt        esophageal  . COPD Maternal Grandfather   . Cancer Paternal Grandmother 20       ?uterine cancer, breast cancer, bone cancer  . Breast cancer Paternal Grandmother 68  . Sudden Cardiac Death Paternal Grandfather 61  . BRCA 1/2 Cousin        BRCA2+  . Other Cousin        Lynch syndrome (PMS2+)       Current Outpatient Medications:  .  fluticasone (FLONASE) 50 MCG/ACT nasal spray, Place 2 sprays into both nostrils daily., Disp: 16 g, Rfl: 6 .  levonorgestrel (MIRENA) 20 MCG/24HR IUD, 1 each by Intrauterine route once., Disp: , Rfl:  .  Multiple Vitamin (MULTIVITAMIN) tablet, Take 1 tablet by mouth daily., Disp: , Rfl:  .  SUMAtriptan (IMITREX) 100 MG tablet, Take 1 tablet (100 mg total) by mouth once for 1 dose. May repeat dose after 2 hours if headache persists or recurs., Disp: 10 tablet, Rfl: 0 .  EPINEPHrine 0.3 mg/0.3 mL IJ SOAJ injection, Inject 0.3 mLs (0.3 mg total) into the muscle once for 1 dose., Disp: 0.3 mL, Rfl: 1  EXAM:  VITALS per patient if applicable:  GENERAL: alert, oriented, appears well and in no acute distress  HEENT: atraumatic, conjunttiva clear, no obvious abnormalities on inspection of external  nose and ears  NECK: normal movements of the head and neck  LUNGS: on inspection no signs of respiratory distress, breathing rate appears normal, no obvious gross SOB, gasping or wheezing  CV: no obvious cyanosis  MS: moves all visible extremities without noticeable abnormality  PSYCH/NEURO: pleasant and cooperative, no obvious depression or anxiety, speech and thought processing grossly intact  ASSESSMENT AND PLAN:  Discussed the following assessment and plan:  Angioedema of lips, initial encounter - Plan: EPINEPHrine 0.3 mg/0.3 mL IJ SOAJ injection  Upper respiratory infection, viral - Plan: fluticasone (FLONASE) 50 MCG/ACT nasal spray Problem List Items Addressed This Visit      Digestive   Angioedema of lips    No  recent episodes of angioedema.  I explained the importance of her carrying epinephrine pen with her at all times and have refilled today.  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   Advised her if she would like she can certainly see allergy again to see if she continues to need to carry the epinephrine pen.  She politely declines at this time.  Patient feels well and has good exercise tolerance. Of note family H/o SCD of her paternal grandfather at 82 which was noted on her paperwork.  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      Relevant Medications   EPINEPHrine 0.3 mg/0.3 mL IJ SOAJ injection    Other Visit Diagnoses    Upper respiratory infection, viral       Relevant Medications   fluticasone (FLONASE) 50 MCG/ACT nasal spray      -we discussed possible serious and likely etiologies, options for evaluation and workup, limitations of telemedicine visit vs in person visit, treatment, treatment risks and precautions. Pt prefers to treat via telemedicine empirically rather then risking or undertaking an in person visit at this moment. Patient agrees to seek prompt in person care if worsening, new symptoms arise, or if is not improving with treatment.   I discussed the assessment and treatment plan with the patient. The patient was provided an opportunity to ask questions and all were answered. The patient agreed with the plan and demonstrated an understanding of the instructions.   The patient was advised to call back or seek an in-person evaluation if the symptoms worsen or if the condition fails to improve as anticipated.   Mable Paris, FNP

## 2019-09-17 NOTE — Assessment & Plan Note (Signed)
No recent episodes of angioedema.  I explained the importance of her carrying epinephrine pen with her at all times and have refilled today.  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   Advised her if she would like she can certainly see allergy again to see if she continues to need to carry the epinephrine pen.  She politely declines at this time.  Patient feels well and has good exercise tolerance. Of note family H/o SCD of her paternal grandfather at 21 which was noted on her paperwork.  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

## 2019-09-17 NOTE — Patient Instructions (Signed)
Please ensure you carry your Epi Pen with you to [facility].   Form is completed and please discuss height/weight restrictions with camp directions as well as any limitations that they advise.   Let me know if you need anything and have a great summer!

## 2019-09-20 DIAGNOSIS — D2271 Melanocytic nevi of right lower limb, including hip: Secondary | ICD-10-CM | POA: Diagnosis not present

## 2019-09-20 DIAGNOSIS — D2262 Melanocytic nevi of left upper limb, including shoulder: Secondary | ICD-10-CM | POA: Diagnosis not present

## 2019-09-20 DIAGNOSIS — L821 Other seborrheic keratosis: Secondary | ICD-10-CM | POA: Diagnosis not present

## 2019-09-20 DIAGNOSIS — D2261 Melanocytic nevi of right upper limb, including shoulder: Secondary | ICD-10-CM | POA: Diagnosis not present

## 2019-09-20 DIAGNOSIS — D225 Melanocytic nevi of trunk: Secondary | ICD-10-CM | POA: Diagnosis not present

## 2019-09-20 DIAGNOSIS — D2272 Melanocytic nevi of left lower limb, including hip: Secondary | ICD-10-CM | POA: Diagnosis not present

## 2019-10-04 ENCOUNTER — Other Ambulatory Visit: Payer: 59

## 2019-10-04 ENCOUNTER — Ambulatory Visit
Admission: RE | Admit: 2019-10-04 | Discharge: 2019-10-04 | Disposition: A | Discharge status: Home / Self Care | Payer: 59 | Source: Ambulatory Visit | Attending: Family | Admitting: Family

## 2019-10-04 ENCOUNTER — Ambulatory Visit
Admission: RE | Admit: 2019-10-04 | Discharge: 2019-10-04 | Disposition: A | Discharge status: Home / Self Care | Payer: 59 | Source: Ambulatory Visit | Attending: Internal Medicine | Admitting: Internal Medicine

## 2019-10-04 DIAGNOSIS — R921 Mammographic calcification found on diagnostic imaging of breast: Secondary | ICD-10-CM | POA: Diagnosis not present

## 2019-10-04 DIAGNOSIS — R928 Other abnormal and inconclusive findings on diagnostic imaging of breast: Secondary | ICD-10-CM | POA: Diagnosis not present

## 2019-10-04 DIAGNOSIS — N6312 Unspecified lump in the right breast, upper inner quadrant: Secondary | ICD-10-CM | POA: Diagnosis not present

## 2019-10-04 DIAGNOSIS — N6323 Unspecified lump in the left breast, lower outer quadrant: Secondary | ICD-10-CM | POA: Diagnosis not present

## 2019-10-05 ENCOUNTER — Other Ambulatory Visit: Payer: Self-pay | Admitting: Family

## 2019-10-05 ENCOUNTER — Telehealth: Payer: Self-pay

## 2019-10-05 DIAGNOSIS — R928 Other abnormal and inconclusive findings on diagnostic imaging of breast: Secondary | ICD-10-CM

## 2019-10-05 NOTE — Telephone Encounter (Signed)
LMTCB & let patient know that mychart message was sent about mammogram. She can respond that way if easier.

## 2019-10-26 ENCOUNTER — Encounter: Payer: Self-pay | Admitting: Nurse Practitioner

## 2019-10-26 ENCOUNTER — Other Ambulatory Visit: Payer: Self-pay | Admitting: Family

## 2019-10-26 ENCOUNTER — Telehealth (INDEPENDENT_AMBULATORY_CARE_PROVIDER_SITE_OTHER): Payer: 59 | Admitting: Nurse Practitioner

## 2019-10-26 VITALS — Ht 68.75 in | Wt 236.0 lb

## 2019-10-26 DIAGNOSIS — J069 Acute upper respiratory infection, unspecified: Secondary | ICD-10-CM | POA: Diagnosis not present

## 2019-10-26 MED ORDER — FLUTICASONE PROPIONATE 50 MCG/ACT NA SUSP: 2.0000 | Freq: Every day | NASAL | 6 refills | Status: DC

## 2019-10-26 MED ORDER — BENZONATATE 200 MG PO CAPS
200.0000 mg | ORAL_CAPSULE | Freq: Three times a day (TID) | ORAL | 0 refills | Status: DC | PRN
Start: 2019-10-26 — End: 2019-12-04

## 2019-10-26 NOTE — Progress Notes (Signed)
Virtual Visit via Virtual Note  This visit type was conducted due to national recommendations for restrictions regarding the COVID-19 pandemic (e.g. social distancing).  This format is felt to be most appropriate for this patient at this time.  All issues noted in this document were discussed and addressed.  No physical exam was performed (except for noted visual exam findings with Video Visits).   I connected with@ on 10/29/19 at  2:30 PM EDT by a video enabled telemedicine application or telephone and verified that I am speaking with the correct person using two identifiers. Location patient: home Location provider: work or home office Persons participating in the virtual visit: patient, provider   I discussed the limitations, risks, security and privacy concerns of performing an evaluation and management service by telephone and the availability of in person appointments. I also discussed with the patient that there may be a patient responsible charge related to this service. The patient expressed understanding and agreed to proceed.    Reason for visit: Dry cough and itchy/scratchy throat  HPI: 39 year old reports an 8-9 day history of a dry irritating cough with itchy throat. Her daughter had the same cough for a week and it resolved without treatment.She does not feel ill. No fevers, chills, headache, sinus pressure or congestion, shortness of breath, chest pain, wheezing.  She is blowing out clear discharge from her nose.  Cough is nonproductive.  She thinks there may be some inner ear fullness as her voice sounds differently to her.  No ear pain.  She did take a Covid test that was negative.  She takes Claritin daily, for chronic allergies, and took 1 dose of Nyquil which made her too sleepy.  She took 2 doses of Mucinex DM and that may help.  She is going to scout camp and will be outdoors all next week.  She is wondering what she can take to make this better.   ROS: See pertinent positives  and negatives per HPI.   Past Medical History:  Diagnosis Date  . Family history of BRCA2 gene positive   . Family history of breast cancer   . Family history of colon cancer   . Family history of colonic polyps   . Family history of esophageal cancer   . Family history of Lynch syndrome   . HPV test positive 2001  . Migraine     Past Surgical History:  Procedure Laterality Date  . BREAST BIOPSY Right 10/17/2017   pseudoangiomatous stromal hyperplasia   . CYST REMOVAL TRUNK  07/2012   [facility]  . VAGINAL DELIVERY     3    Family History  Problem Relation Age of Onset  . Thyroid disease Father   . Heart disease Father        2011, MI  . Colon polyps Father   . Thyroid disease Sister   . Cancer Maternal Aunt 60       colon cancer  . Cancer Paternal Aunt        esophageal  . COPD Maternal Grandfather   . Cancer Paternal Grandmother 37       ?uterine cancer, breast cancer, bone cancer  . Breast cancer Paternal Grandmother 109  . Sudden Cardiac Death Paternal Grandfather 71  . BRCA 1/2 Cousin        BRCA2+  . Other Cousin        Lynch syndrome (PMS2+)    SOCIAL HX: Never smoked   Current Outpatient Medications:  .  fluticasone (FLONASE) 50 MCG/ACT nasal spray, Place 2 sprays into both nostrils daily., Disp: 16 g, Rfl: 6 .  levonorgestrel (MIRENA) 20 MCG/24HR IUD, 1 each by Intrauterine route once., Disp: , Rfl:  .  Multiple Vitamin (MULTIVITAMIN) tablet, Take 1 tablet by mouth daily., Disp: , Rfl:  .  benzonatate (TESSALON) 200 MG capsule, Take 1 capsule (200 mg total) by mouth 3 (three) times daily as needed for cough., Disp: 20 capsule, Rfl: 0 .  SUMAtriptan (IMITREX) 100 MG tablet, TAKE 1 TABLET BY MOUTH ONCE FOR 1 DOSE. MAY REPEAT DOSE AFTER 2 HOURS IF HEADACHE PERSISTS OR RECURS., Disp: 9 tablet, Rfl: 0  EXAM:  VITALS per patient if applicable: Wt 236  GENERAL: alert, oriented, appears well and in no acute distress  HEENT: atraumatic, conjunctiva clear,  no obvious abnormalities on inspection of external nose and ears  NECK: normal movements of the head and neck  LUNGS: on inspection no signs of respiratory distress, breathing rate appears normal, no obvious gross SOB, gasping or wheezing. NO cough noted during visit.  CV: no obvious cyanosis  MS: moves all visible extremities without noticeable abnormality  PSYCH/NEURO: pleasant and cooperative, no obvious depression or anxiety, speech and thought processing grossly intact  ASSESSMENT AND PLAN:  Discussed the following assessment and plan:  Upper respiratory infection, viral - Plan: fluticasone (FLONASE) 50 MCG/ACT nasal spray  Patient advised:  You do not have the symptoms of a bacterial infection. Thi is likely a lingering viral cough. Thank you for already getting a negative Covid test. That is reassuring to others around you. But, you still should do really good hand hygiene, cover your cough because viruses can spread as well.  Please purchase SIMPLY SALINE which is in normal saline nasal rinse.  Spray to your nose, and you can do that in the morning and a couple times during the day if you need to.  You would do this, and blocks your nose.  Then apply your FLONASE morning and at bedtime.    Take the Zyrtec 10 mg daily.   You can take the Mucinex DM if you think it helps.   I have ordered Tessalon Perles for your daytime cough if needed.  Push fluids, rest, Tylenol if you develop body aches, and monitor for worsening symptoms.  If you develop headache, facial or sinus pain, wheezing, chest pain, fever, increased fatigue, or any fatigue, pleas seek in-person care.   I discussed the assessment and treatment plan with the patient. The patient was provided an opportunity to ask questions and all were answered. The patient agreed with the plan and demonstrated an understanding of the instructions.   The patient was advised to call back or seek an in-person evaluation if the symptoms  worsen or if the condition fails to improve as anticipated.   Denice Paradise, NP Adult Nurse Practitioner [place] [phone]

## 2019-10-26 NOTE — Patient Instructions (Addendum)
You do not have the symptoms of a bacterial infection. Thi is likely a lingering viral cough. Thank you for already getting a negative Covid test. That is reassuring to others around you. But, you still should do really good hand hygiene, cover your cough because viruses can spread as well.  Please purchase SIMPLY SALINE which is in normal saline nasal rinse.  Spray to your nose, and you can do that in the morning and a couple times during the day if you need to.  You would do this, and blocks your nose.  Then apply your FLONASE morning and at bedtime.    Take the Zyrtec 10 mg daily.   You can take the Mucinex DM if you think it helps.   I have ordered Tessalon Perles for your daytime cough if needed.  Push fluids, rest, Tylenol if you develop body aches, and monitor for worsening symptoms.  If you develop headache, facial or sinus pain, wheezing, chest pain, fever, increased fatigue, or any fatigue, pleas seek in-person care.   Upper Respiratory Infection, Adult An upper respiratory infection (URI) is a common viral infection of the nose, throat, and upper air passages that lead to the lungs. The most common type of URI is the common cold. URIs usually get better on their own, without medical treatment. What are the causes? A URI is caused by a virus. You may catch a virus by:  Breathing in droplets from an infected person's cough or sneeze.  Touching something that has been exposed to the virus (contaminated) and then touching your mouth, nose, or eyes. What increases the risk? You are more likely to get a URI if:  You are very young or very old.  It is autumn or winter.  You have close contact with others, such as at a daycare, school, or health care facility.  You smoke.  You have long-term (chronic) heart or lung disease.  You have a weakened disease-fighting (immune) system.  You have nasal allergies or asthma.  You are experiencing a lot of stress.  You work in an area  that has poor air circulation.  You have poor nutrition. What are the signs or symptoms? A URI usually involves some of the following symptoms:  Runny or stuffy (congested) nose.  Sneezing.  Cough.  Sore throat.  Headache.  Fatigue.  Fever.  Loss of appetite.  Pain in your forehead, behind your eyes, and over your cheekbones (sinus pain).  Muscle aches.  Redness or irritation of the eyes.  Pressure in the ears or face. How is this diagnosed? This condition may be diagnosed based on your medical history and symptoms, and a physical exam. Your health care provider may use a cotton swab to take a mucus sample from your nose (nasal swab). This sample can be tested to determine what virus is causing the illness. How is this treated? URIs usually get better on their own within 7-10 days. You can take steps at home to relieve your symptoms. Medicines cannot cure URIs, but your health care provider may recommend certain medicines to help relieve symptoms, such as:  Over-the-counter cold medicines.  Cough suppressants. Coughing is a type of defense against infection that helps to clear the respiratory system, so take these medicines only as recommended by your health care provider.  Fever-reducing medicines. Follow these instructions at home: Activity  Rest as needed.  If you have a fever, stay home from work or school until your fever is gone or until your  health care provider says you are no longer contagious. Your health care provider may have you wear a face mask to prevent your infection from spreading. Relieving symptoms  Gargle with a salt-water mixture 3-4 times a day or as needed. To make a salt-water mixture, completely dissolve -1 tsp of salt in 1 cup of warm water.  Use a cool-mist humidifier to add moisture to the air. This can help you breathe more easily. Eating and drinking   Drink enough fluid to keep your urine pale yellow.  Eat soups and other clear  broths. General instructions   Take over-the-counter and prescription medicines only as told by your health care provider. These include cold medicines, fever reducers, and cough suppressants.  Do not use any products that contain nicotine or tobacco, such as cigarettes and e-cigarettes. If you need help quitting, ask your health care provider.  Stay away from secondhand smoke.  Stay up to date on all immunizations, including the yearly (annual) flu vaccine.  Keep all follow-up visits as told by your health care provider. This is important. How to prevent the spread of infection to others   URIs can be passed from person to person (are contagious). To prevent the infection from spreading: ? Wash your hands often with soap and water. If soap and water are not available, use hand sanitizer. ? Avoid touching your mouth, face, eyes, or nose. ? Cough or sneeze into a tissue or your sleeve or elbow instead of into your hand or into the air. Contact a health care provider if:  You are getting worse instead of better.  You have a fever or chills.  Your mucus is brown or red.  You have yellow or brown discharge coming from your nose.  You have pain in your face, especially when you bend forward.  You have swollen neck glands.  You have pain while swallowing.  You have white areas in the back of your throat. Get help right away if:  You have shortness of breath that gets worse.  You have severe or persistent: ? Headache. ? Ear pain. ? Sinus pain. ? Chest pain.  You have chronic lung disease along with any of the following: ? Wheezing. ? Prolonged cough. ? Coughing up blood. ? A change in your usual mucus.  You have a stiff neck.  You have changes in your: ? Vision. ? Hearing. ? Thinking. ? Mood. Summary  An upper respiratory infection (URI) is a common infection of the nose, throat, and upper air passages that lead to the lungs.  A URI is caused by a virus.   URIs usually get better on their own within 7-10 days.  Medicines cannot cure URIs, but your health care provider may recommend certain medicines to help relieve symptoms. This information is not intended to replace advice given to you by your health care provider. Make sure you discuss any questions you have with your health care provider. Document Revised: 04/27/2018 Document Reviewed: 12/03/2016 Elsevier Patient Education  [place].

## 2019-10-29 ENCOUNTER — Encounter: Payer: Self-pay | Admitting: Nurse Practitioner

## 2019-12-04 ENCOUNTER — Encounter: Payer: Self-pay | Admitting: Family

## 2019-12-04 ENCOUNTER — Other Ambulatory Visit (HOSPITAL_COMMUNITY)
Admission: RE | Admit: 2019-12-04 | Discharge: 2019-12-04 | Disposition: A | Discharge status: Home / Self Care | Payer: 59 | Source: Ambulatory Visit | Attending: Family | Admitting: Family

## 2019-12-04 ENCOUNTER — Ambulatory Visit (INDEPENDENT_AMBULATORY_CARE_PROVIDER_SITE_OTHER): Payer: 59 | Admitting: Family

## 2019-12-04 ENCOUNTER — Other Ambulatory Visit: Payer: Self-pay

## 2019-12-04 ENCOUNTER — Other Ambulatory Visit: Payer: Self-pay | Admitting: Family

## 2019-12-04 DIAGNOSIS — Z124 Encounter for screening for malignant neoplasm of cervix: Secondary | ICD-10-CM

## 2019-12-04 NOTE — Patient Instructions (Signed)
Nice to see you!   

## 2019-12-04 NOTE — Assessment & Plan Note (Signed)
Pap performed.

## 2019-12-04 NOTE — Progress Notes (Signed)
Subjective:    Patient ID: Kara Vaughn, female    DOB: October 17, 1980, 39 y.o.   MRN: [id number]  CC: Kara Vaughn is a 39 y.o. female who presents today for follow up for repeat pap smear  HPI: No complaints, feels well.    Pap 07/2019 -Inadequate cells on Pap smear   HISTORY:  Past Medical History:  Diagnosis Date  . Family history of BRCA2 gene positive   . Family history of breast cancer   . Family history of colon cancer   . Family history of colonic polyps   . Family history of esophageal cancer   . Family history of Lynch syndrome   . HPV test positive 2001  . Migraine    Past Surgical History:  Procedure Laterality Date  . BREAST BIOPSY Right 10/17/2017   pseudoangiomatous stromal hyperplasia   . CYST REMOVAL TRUNK  07/2012   [facility]  . VAGINAL DELIVERY     3   Family History  Problem Relation Age of Onset  . Thyroid disease Father   . Heart disease Father        2011, MI  . Colon polyps Father   . Thyroid disease Sister   . Cancer Maternal Aunt 60       colon cancer  . Cancer Paternal Aunt        esophageal  . COPD Maternal Grandfather   . Cancer Paternal Grandmother 53       ?uterine cancer, breast cancer, bone cancer  . Breast cancer Paternal Grandmother 38  . Sudden Cardiac Death Paternal Grandfather 61  . BRCA 1/2 Cousin        BRCA2+  . Other Cousin        Lynch syndrome (PMS2+)    Allergies: Patient has no known allergies. Current Outpatient Medications on File Prior to Visit  Medication Sig Dispense Refill  . fluticasone (FLONASE) 50 MCG/ACT nasal spray Place 2 sprays into both nostrils daily. 16 g 6  . levonorgestrel (MIRENA) 20 MCG/24HR IUD 1 each by Intrauterine route once.    . Multiple Vitamin (MULTIVITAMIN) tablet Take 1 tablet by mouth daily.    . SUMAtriptan (IMITREX) 100 MG tablet TAKE 1 TABLET BY MOUTH ONCE FOR 1 DOSE. MAY REPEAT DOSE AFTER 2 HOURS IF HEADACHE PERSISTS OR RECURS. 9 tablet 0   No current facility-administered  medications on file prior to visit.    Social History   Tobacco Use  . Smoking status: Never Smoker  . Smokeless tobacco: Never Used  Substance Use Topics  . Alcohol use: Yes    Comment: Rarely  . Drug use: No    Review of Systems  Constitutional: Negative for chills and fever.  Respiratory: Negative for cough.   Cardiovascular: Negative for chest pain and palpitations.  Gastrointestinal: Negative for nausea and vomiting.  Genitourinary: Negative for pelvic pain.      Objective:    BP 112/76 (BP Location: Left Arm, Patient Position: Sitting, Cuff Size: Normal)   Pulse 77   Temp 98.1 F (36.7 C) (Oral)   Resp 14   Ht 5' 8.75" (1.746 m)   Wt 235 lb 3.2 oz (106.7 kg)   SpO2 97%   BMI 34.99 kg/m  BP Readings from Last 3 Encounters:  12/04/19 112/76  07/25/19 110/70  07/21/18 102/60   Wt Readings from Last 3 Encounters:  12/04/19 235 lb 3.2 oz (106.7 kg)  10/26/19 236 lb (107 kg)  09/17/19 239 lb (108.4 kg)  Physical Exam Vitals reviewed.  Constitutional:      Appearance: She is well-developed.  Eyes:     Conjunctiva/sclera: Conjunctivae normal.  Cardiovascular:     Rate and Rhythm: Normal rate and regular rhythm.     Pulses: Normal pulses.     Heart sounds: Normal heart sounds.  Pulmonary:     Effort: Pulmonary effort is normal.     Breath sounds: Normal breath sounds. No wheezing, rhonchi or rales.  Genitourinary:    Uterus: Not enlarged and not tender.      Adnexa: Right adnexa normal and left adnexa normal.       Right: No mass, tenderness or fullness.         Left: No mass, tenderness or fullness.       Comments: Pap performed of cervix.  Skin:    General: Skin is warm and dry.  Neurological:     Mental Status: She is alert.  Psychiatric:        Speech: Speech normal.        Behavior: Behavior normal.        Thought Content: Thought content normal.        Assessment & Plan:   Problem List Items Addressed This Visit      Other    Cervical cancer screening    Pap performed.          I have discontinued Bibi Cuadra's benzonatate. I am also having her maintain her multivitamin, levonorgestrel, fluticasone, and SUMAtriptan.   No orders of the defined types were placed in this encounter.   Return precautions given.   Risks, benefits, and alternatives of the medications and treatment plan prescribed today were discussed, and patient expressed understanding.   Education regarding symptom management and diagnosis given to patient on AVS.  Continue to follow with Burnard Hawthorne, FNP for routine health maintenance.   Donaciano Eva and I agreed with plan.   Mable Paris, FNP

## 2019-12-05 LAB — CYTOLOGY - PAP
Comment: NEGATIVE
Diagnosis: NEGATIVE
High risk HPV: NEGATIVE

## 2020-04-07 ENCOUNTER — Other Ambulatory Visit: Payer: 59

## 2020-04-09 ENCOUNTER — Other Ambulatory Visit: Payer: Self-pay

## 2020-04-09 ENCOUNTER — Ambulatory Visit
Admission: RE | Admit: 2020-04-09 | Discharge: 2020-04-09 | Disposition: A | Discharge status: Home / Self Care | Payer: 59 | Source: Ambulatory Visit | Attending: Family | Admitting: Family

## 2020-04-09 DIAGNOSIS — N6323 Unspecified lump in the left breast, lower outer quadrant: Secondary | ICD-10-CM | POA: Diagnosis not present

## 2020-04-09 DIAGNOSIS — R928 Other abnormal and inconclusive findings on diagnostic imaging of breast: Secondary | ICD-10-CM | POA: Diagnosis not present

## 2020-07-28 ENCOUNTER — Other Ambulatory Visit (HOSPITAL_COMMUNITY)
Admission: RE | Admit: 2020-07-28 | Discharge: 2020-07-28 | Disposition: A | Discharge status: Home / Self Care | Payer: 59 | Source: Ambulatory Visit | Attending: Family | Admitting: Family

## 2020-07-28 ENCOUNTER — Encounter: Payer: Self-pay | Admitting: Family

## 2020-07-28 ENCOUNTER — Ambulatory Visit (INDEPENDENT_AMBULATORY_CARE_PROVIDER_SITE_OTHER): Payer: 59 | Admitting: Family

## 2020-07-28 ENCOUNTER — Other Ambulatory Visit: Payer: Self-pay

## 2020-07-28 VITALS — BP 102/60 | HR 79 | Temp 98.0°F | Ht 68.75 in | Wt 246.4 lb

## 2020-07-28 DIAGNOSIS — G43009 Migraine without aura, not intractable, without status migrainosus: Secondary | ICD-10-CM | POA: Diagnosis not present

## 2020-07-28 DIAGNOSIS — Z0000 Encounter for general adult medical examination without abnormal findings: Secondary | ICD-10-CM

## 2020-07-28 LAB — CBC WITH DIFFERENTIAL/PLATELET
Basophils Absolute: 0.1 10*3/uL (ref 0.0–0.1)
Basophils Relative: 0.9 % (ref 0.0–3.0)
Eosinophils Absolute: 0.2 10*3/uL (ref 0.0–0.7)
Eosinophils Relative: 2.8 % (ref 0.0–5.0)
HCT: 44 % (ref 36.0–46.0)
Hemoglobin: 15.1 g/dL — ABNORMAL HIGH (ref 12.0–15.0)
Lymphocytes Relative: 23.1 % (ref 12.0–46.0)
Lymphs Abs: 1.8 10*3/uL (ref 0.7–4.0)
MCHC: 34.3 g/dL (ref 30.0–36.0)
MCV: 88.1 fl (ref 78.0–100.0)
Monocytes Absolute: 0.5 10*3/uL (ref 0.1–1.0)
Monocytes Relative: 7.2 % (ref 3.0–12.0)
Neutro Abs: 5 10*3/uL (ref 1.4–7.7)
Neutrophils Relative %: 66 % (ref 43.0–77.0)
Platelets: 265 10*3/uL (ref 150.0–400.0)
RBC: 4.99 Mil/uL (ref 3.87–5.11)
RDW: 13.7 % (ref 11.5–15.5)
WBC: 7.6 10*3/uL (ref 4.0–10.5)

## 2020-07-28 LAB — LIPID PANEL
Cholesterol: 146 mg/dL (ref 0–200)
HDL: 52.7 mg/dL (ref >39.00)
LDL Cholesterol: 73 mg/dL (ref 0–99)
NonHDL: 93.04
Total CHOL/HDL Ratio: 3
Triglycerides: 102 mg/dL (ref 0.0–149.0)
VLDL: 20.4 mg/dL (ref 0.0–40.0)

## 2020-07-28 LAB — COMPREHENSIVE METABOLIC PANEL
ALT: 15 U/L (ref 0–35)
AST: 16 U/L (ref 0–37)
Albumin: 4.2 g/dL (ref 3.5–5.2)
Alkaline Phosphatase: 40 U/L (ref 39–117)
BUN: 8 mg/dL (ref 6–23)
CO2: 30 mEq/L (ref 19–32)
Calcium: 8.9 mg/dL (ref 8.4–10.5)
Chloride: 102 mEq/L (ref 96–112)
Creatinine, Ser: 0.68 mg/dL (ref 0.40–1.20)
GFR: 108.81 mL/min (ref >60.00)
Glucose, Bld: 99 mg/dL (ref 70–99)
Potassium: 4.1 mEq/L (ref 3.5–5.1)
Sodium: 139 mEq/L (ref 135–145)
Total Bilirubin: 1.5 mg/dL — ABNORMAL HIGH (ref 0.2–1.2)
Total Protein: 5.9 g/dL — ABNORMAL LOW (ref 6.0–8.3)

## 2020-07-28 LAB — VITAMIN D 25 HYDROXY (VIT D DEFICIENCY, FRACTURES): VITD: 13.95 ng/mL — ABNORMAL LOW (ref 30.00–100.00)

## 2020-07-28 LAB — HEMOGLOBIN A1C: Hgb A1c MFr Bld: 5.6 % (ref 4.6–6.5)

## 2020-07-28 LAB — TSH: TSH: 2.58 u[IU]/mL (ref 0.35–4.50)

## 2020-07-28 NOTE — Patient Instructions (Addendum)
Please call  and schedule your 3D mammogram  as discussed which is due may 2022   [place]  [place], [place] As discussed today regarding colon cancer risk ( CRC)  and your family history of polyps.   Typically we start screening at 41  years old for AVERAGE risk people.   We assess the risk for CRC at the initial visit for an adult who is age 52 years or older to identify high-risk patients who should begin CRC screening at an earlier age than those at average risk. Subsequent reassessment every three to five years identifies whether the patient or family members have developed factors that raise the patient's level of risk for CRC  Factors important to determine CRC risk can be assessed by asking several questions. A "no" response to all of these questions generally indicates AVERAGE risk.  ?Have you ever had CRC or an adenomatous polyp? A personal history of CRC increases the risk of another primary (metachronous) cancer. Surveillance following CRC is discussed separately. A personal history of adenomatous colorectal polyps increases the risk of CRC . The number and types of polyp lesions guide the determination of the appropriate interval for surveillance.  If there is uncertainty about the patient's personal polyp history, records should be obtained to determine if the patient had an adenomatous polyp. If records cannot be obtained, the patient may recall being told a polyp was found and advised to have a follow-up colonoscopy in five years or sooner, suggesting the polyp was adenomatous.  ?Have any family members had CRC or a documented advanced polyp? An advanced polyp is defined as an advanced adenoma (adenoma ?1 cm, or with high-grade dysplasia, or with villous elements) or advanced serrated lesion (sessile serrated polyp [SSP] ?1 cm, or traditional serrated adenoma ?1 cm, or SSP with cytologic dysplasia).  The [place] Multi-Society  Task Force (MSTF) on Colorectal Cancer recommends that, if there is documentation that an FDR had an advanced adenoma (adenoma ?1 cm, or with high-grade dysplasia, or with villous elements) or polyp requiring surgical excision, this should be weighted the same as having an FDR with CRC when suggesting screening programs [20]. The yield of screening colonoscopy in patients with FDRs who had advanced adenomas is substantially increased. However, the MSTF no longer recommends that patients undergo intensified colon screening without clear documentation that an FDR's polyp was advanced; in the absence of documentation that an FDR's polyp was advanced, it should be assumed it was not advanced   If so, how many family members, were they first-degree relatives (parent, sibling, or child), and at what age was the cancer or polyp first diagnosed? Enhanced screening is warranted for a patient with increased risk of CRC due to a family history of CRC or documented advanced polyp and is described in detail separately. If a family member is reported to have had a polyp but there is lack of available documentation about the type of polyp, the patient is typically screened as if a family member did not have an advanced polyp. If the patient cannot obtain any family history whatsoever related to colorectal cancer or polyps, some experts suggest screening the patient as average risk, although there are no data that evaluate that approach. ?Do you have family members with any of the known genetic syndromes ( such as Lynch Syndrome) that can cause CRC? Patients with a family history of a known genetic syndrome for CRC may require enhanced screening  plus genetic counseling.   Obtaining family history permits determination of the number of First Degree Relatives( FDR) diagnosed with ANY of the following: ?Colorectal cancer (CRC) ?Documented advanced polyp with ANY of the following: .Advanced adenoma -Adenoma size ?1  cm -Adenoma with high-grade dysplasia -Adenoma with villous elements .Advanced serrated lesion -Sessile serrated polyp (SSP) ?1 cm -Traditional serrated adenoma ?1 cm -SSP with cytologic dysplasia  We suggest using the information about first-degree relatives (FDRs) with ANY of the above to screen as follows: ?One FDR diagnosed at age <60 years - Begin screening at age 23 years, or 10 years before the FDR's diagnosis, whichever is earlier. We suggest colonoscopy every five years. If the patient declines colonoscopy, annual fecal immunochemical testing (FIT) should be offered. ?Two or more FDRs diagnosed at any age - Begin screening at age 14 years, or 10 years before the youngest FDR's diagnosis, whichever is earlier. We suggest colonoscopy every five years. If the patient declines colonoscopy, annual FIT should be offered. ?One FDR diagnosed at age ?55 years - Begin screening at age 25 years, using the same screening options as for average-risk patients, at the same frequency as for average-risk patients. If the only family history is an FDR with a polyp NOT clearly documented as an advanced adenoma or serrated lesion, we suggest that the patient be screened as an average-risk patient, because of the potential inaccuracy of the family history. This is in contrast to patients whose FDR has a documented history of an advanced adenoma or serrated lesion, who should be screened similarly to those with a family history of CRC, as indicated above.  These suggestions are in keeping with the 2017 guidelines from the [place] Multi-Society Task Force (MSTF) on Colorectal Cancer, issued collaboratively on behalf of the [employer], the [employer], and the [place] [facility]    Health Maintenance, Female Adopting a healthy lifestyle and getting preventive care are important in promoting health and wellness. Ask your health care  provider about:  The right schedule for you to have regular tests and exams.  Things you can do on your own to prevent diseases and keep yourself healthy. What should I know about diet, weight, and exercise? Eat a healthy diet  Eat a diet that includes plenty of vegetables, fruits, low-fat dairy products, and lean protein.  Do not eat a lot of foods that are high in solid fats, added sugars, or sodium.   Maintain a healthy weight Body mass index (BMI) is used to identify weight problems. It estimates body fat based on height and weight. Your health care provider can help determine your BMI and help you achieve or maintain a healthy weight. Get regular exercise Get regular exercise. This is one of the most important things you can do for your health. Most adults should:  Exercise for at least 150 minutes each week. The exercise should increase your heart rate and make you sweat (moderate-intensity exercise).  Do strengthening exercises at least twice a week. This is in addition to the moderate-intensity exercise.  Spend less time sitting. Even light physical activity can be beneficial. Watch cholesterol and blood lipids Have your blood tested for lipids and cholesterol at 41 years of age, then have this test every 5 years. Have your cholesterol levels checked more often if:  Your lipid or cholesterol levels are high.  You are older than 41 years of age.  You are at high risk for heart disease. What should  I know about cancer screening? Depending on your health history and family history, you may need to have cancer screening at various ages. This may include screening for:  Breast cancer.  Cervical cancer.  Colorectal cancer.  Skin cancer.  Lung cancer. What should I know about heart disease, diabetes, and high blood pressure? Blood pressure and heart disease  High blood pressure causes heart disease and increases the risk of stroke. This is more likely to develop in  people who have high blood pressure readings, are of African descent, or are overweight.  Have your blood pressure checked: ? Every 3-5 years if you are 4-66 years of age. ? Every year if you are 56 years old or older. Diabetes Have regular diabetes screenings. This checks your fasting blood sugar level. Have the screening done:  Once every three years after age 21 if you are at a normal weight and have a low risk for diabetes.  More often and at a younger age if you are overweight or have a high risk for diabetes. What should I know about preventing infection? Hepatitis B If you have a higher risk for hepatitis B, you should be screened for this virus. Talk with your health care provider to find out if you are at risk for hepatitis B infection. Hepatitis C Testing is recommended for:  Everyone born from [birth year] through 1966.  Anyone with known risk factors for hepatitis C. Sexually transmitted infections (STIs)  Get screened for STIs, including gonorrhea and chlamydia, if: ? You are sexually active and are younger than 41 years of age. ? You are older than 41 years of age and your health care provider tells you that you are at risk for this type of infection. ? Your sexual activity has changed since you were last screened, and you are at increased risk for chlamydia or gonorrhea. Ask your health care provider if you are at risk.  Ask your health care provider about whether you are at high risk for HIV. Your health care provider may recommend a prescription medicine to help prevent HIV infection. If you choose to take medicine to prevent HIV, you should first get tested for HIV. You should then be tested every 3 months for as long as you are taking the medicine. Pregnancy  If you are about to stop having your period (premenopausal) and you may become pregnant, seek counseling before you get pregnant.  Take 400 to 800 micrograms (mcg) of folic acid every day if you become  pregnant.  Ask for birth control (contraception) if you want to prevent pregnancy. Osteoporosis and menopause Osteoporosis is a disease in which the bones lose minerals and strength with aging. This can result in bone fractures. If you are 24 years old or older, or if you are at risk for osteoporosis and fractures, ask your health care provider if you should:  Be screened for bone loss.  Take a calcium or vitamin D supplement to lower your risk of fractures.  Be given hormone replacement therapy (HRT) to treat symptoms of menopause. Follow these instructions at home: Lifestyle  Do not use any products that contain nicotine or tobacco, such as cigarettes, e-cigarettes, and chewing tobacco. If you need help quitting, ask your health care provider.  Do not use street drugs.  Do not share needles.  Ask your health care provider for help if you need support or information about quitting drugs. Alcohol use  Do not drink alcohol if: ? Your health care provider tells  you not to drink. ? You are pregnant, may be pregnant, or are planning to become pregnant.  If you drink alcohol: ? Limit how much you use to 0-1 drink a day. ? Limit intake if you are breastfeeding.  Be aware of how much alcohol is in your drink. In the [place] one drink equals one 12 oz bottle of beer (355 mL), one 5 oz glass of wine (148 mL), or one 1 oz glass of hard liquor (44 mL). General instructions  Schedule regular health, dental, and eye exams.  Stay current with your vaccines.  Tell your health care provider if: ? You often feel depressed. ? You have ever been abused or do not feel safe at home. Summary  Adopting a healthy lifestyle and getting preventive care are important in promoting health and wellness.  Follow your health care provider's instructions about healthy diet, exercising, and getting tested or screened for diseases.  Follow your health care provider's instructions on monitoring your  cholesterol and blood pressure. This information is not intended to replace advice given to you by your health care provider. Make sure you discuss any questions you have with your health care provider. Document Revised: 04/12/2018 Document Reviewed: 04/12/2018 Elsevier Patient Education  2021 [employer].

## 2020-07-28 NOTE — Progress Notes (Signed)
Subjective:    Patient ID: Kara Vaughn, female    DOB: 1980/12/16, 40 y.o.   MRN: [id number]  CC: Kara Vaughn is a 40 y.o. female who presents today for physical exam.    HPI: Feels well today. No concerns No depression, anxiety.     Migraine-controlled.  she has not had a migraine in over year.  uses imitrex $RemoveBefo'100mg'kbwcovDzjxR$  with relief. HA was similar to HAs in the past.    Colorectal Cancer Screening: No early family h/o colon cancer; father has polyps, and goes to have colonoscopy every 3 years.  Breast Cancer Screening: Bilateral diagnostic mammogram and left breast ultrasound in 6 Months; due May 2022 Cervical Cancer Screening: UTD, 7 months ago. Patient  Prefers to have done annually with CPE.          Tetanus - utd        Hepatitis C screening - Candidate for, consents  Labs: Screening labs today. Exercise: Gets regular exercise, walking.   Alcohol use:  rare Smoking/tobacco use: Nonsmoker.     HISTORY:  Past Medical History:  Diagnosis Date  . Family history of BRCA2 gene positive   . Family history of breast cancer   . Family history of colon cancer   . Family history of colonic polyps   . Family history of esophageal cancer   . Family history of Lynch syndrome   . HPV test positive 2001  . Migraine     Past Surgical History:  Procedure Laterality Date  . BREAST BIOPSY Right 10/17/2017   pseudoangiomatous stromal hyperplasia   . CYST REMOVAL TRUNK  07/2012   [facility]  . VAGINAL DELIVERY     3   Family History  Problem Relation Age of Onset  . Thyroid disease Father   . Heart disease Father        2011, MI  . Colon polyps Father   . Thyroid disease Sister   . Cancer Maternal Aunt 60       colon cancer  . Cancer Paternal Aunt        esophageal  . COPD Maternal Grandfather   . Cancer Paternal Grandmother 44       ?uterine cancer, breast cancer, bone cancer  . Breast cancer Paternal Grandmother 96  . Sudden Cardiac Death Paternal Grandfather 58  .  BRCA 1/2 Cousin        BRCA2+  . Other Cousin        Lynch syndrome (PMS2+)      ALLERGIES: Patient has no known allergies.  Current Outpatient Medications on File Prior to Visit  Medication Sig Dispense Refill  . fexofenadine (ALLEGRA) 180 MG tablet Take 180 mg by mouth daily.    . fluticasone (FLONASE) 50 MCG/ACT nasal spray Place 2 sprays into both nostrils daily. 16 g 6  . levonorgestrel (MIRENA) 20 MCG/24HR IUD 1 each by Intrauterine route once.    . Multiple Vitamin (MULTIVITAMIN) tablet Take 1 tablet by mouth daily.    . SUMAtriptan (IMITREX) 100 MG tablet TAKE 1 TABLET BY MOUTH ONCE FOR 1 DOSE. MAY REPEAT DOSE AFTER 2 HOURS IF HEADACHE PERSISTS OR RECURS. 9 tablet 0   No current facility-administered medications on file prior to visit.    Social History   Tobacco Use  . Smoking status: Never Smoker  . Smokeless tobacco: Never Used  Substance Use Topics  . Alcohol use: Yes    Comment: Rarely  . Drug use: No    Review of  Systems  Constitutional: Negative for chills, fever and unexpected weight change.  HENT: Negative for congestion.   Eyes: Negative for visual disturbance.  Respiratory: Negative for cough.   Cardiovascular: Negative for chest pain, palpitations and leg swelling.  Gastrointestinal: Negative for nausea and vomiting.  Musculoskeletal: Negative for arthralgias and myalgias.  Skin: Negative for rash.  Neurological: Negative for headaches (excellent control).  Hematological: Negative for adenopathy.  Psychiatric/Behavioral: Negative for confusion.      Objective:    BP 102/60   Pulse 79   Temp 98 F (36.7 C)   Ht 5' 8.75" (1.746 m)   Wt 246 lb 6.4 oz (111.8 kg)   SpO2 98%   BMI 36.65 kg/m   BP Readings from Last 3 Encounters:  07/28/20 102/60  12/04/19 112/76  07/25/19 110/70   Wt Readings from Last 3 Encounters:  07/28/20 246 lb 6.4 oz (111.8 kg)  12/04/19 235 lb 3.2 oz (106.7 kg)  10/26/19 236 lb (107 kg)    Physical Exam Vitals  reviewed.  Constitutional:      Appearance: She is well-developed.  Eyes:     Conjunctiva/sclera: Conjunctivae normal.  Neck:     Thyroid: No thyroid mass or thyromegaly.  Cardiovascular:     Rate and Rhythm: Normal rate and regular rhythm.     Pulses: Normal pulses.     Heart sounds: Normal heart sounds.  Pulmonary:     Effort: Pulmonary effort is normal.     Breath sounds: Normal breath sounds. No wheezing, rhonchi or rales.  Chest:  Breasts: Breasts are symmetrical.     Right: No inverted nipple, mass, nipple discharge, skin change or tenderness.     Left: No inverted nipple, mass, nipple discharge, skin change or tenderness.    Genitourinary:    Cervix: No cervical motion tenderness, discharge or friability.     Uterus: Not enlarged, not fixed and not tender.      Adnexa:        Right: No mass, tenderness or fullness.         Left: No mass, tenderness or fullness.       Comments: Pap performed. No CMT. Unable to appreciated ovaries. IUD string seen coming from os.  Lymphadenopathy:     Head:     Right side of head: No submental, submandibular, tonsillar, preauricular, posterior auricular or occipital adenopathy.     Left side of head: No submental, submandibular, tonsillar, preauricular, posterior auricular or occipital adenopathy.     Cervical:     Right cervical: No superficial, deep or posterior cervical adenopathy.    Left cervical: No superficial, deep or posterior cervical adenopathy.     Upper Body:     Right upper body: No pectoral adenopathy.     Left upper body: No pectoral adenopathy.  Skin:    General: Skin is warm and dry.  Neurological:     Mental Status: She is alert.  Psychiatric:        Speech: Speech normal.        Behavior: Behavior normal.        Thought Content: Thought content normal.        Assessment & Plan:   Problem List Items Addressed This Visit      Cardiovascular and Mediastinum   Migraine    Rare HA. Excellent control of blood  pressure. Continue imitrex $RemoveBeforeDE'100mg'TEvHUNluAbwfLnS$ .         Other   Routine general medical examination at a health care facility -  Primary    Long discussion regarding familial h/o regarding father's polyps , half cousin with Lynch syndrome. Advised GI consult to determine if appropriate to start colon cancer screening ahead of 41 years of age. I also advised she may speak to father's and ask him to ask his gastroenterologist would advise earlier screening based on type of colon polyps. Patient will get back to me with how to proceed. Patient will schedule mammogram. Encouraged exercise.       Relevant Orders   MM DIAG BREAST TOMO BILATERAL   US BREAST LTD UNI LEFT INC AXILLA   Cytology - PAP   TSH   CBC with Differential/Platelet   Comprehensive metabolic panel   Hemoglobin A1c   Hepatitis C antibody   Lipid panel   VITAMIN D 25 Hydroxy (Vit-D Deficiency, Fractures)       I am having Douglas Rooks maintain her multivitamin, levonorgestrel, fluticasone, SUMAtriptan, and fexofenadine.   No orders of the defined types were placed in this encounter.   Return precautions given.   Risks, benefits, and alternatives of the medications and treatment plan prescribed today were discussed, and patient expressed understanding.   Education regarding symptom management and diagnosis given to patient on AVS.   Continue to follow with Burnard Hawthorne, FNP for routine health maintenance.   Donaciano Eva and I agreed with plan.   Mable Paris, FNP

## 2020-07-28 NOTE — Assessment & Plan Note (Signed)
Rare HA. Excellent control of blood pressure. Continue imitrex 100mg .

## 2020-07-28 NOTE — Assessment & Plan Note (Signed)
Long discussion regarding familial h/o regarding father's polyps , half cousin with Lynch syndrome. Advised GI consult to determine if appropriate to start colon cancer screening ahead of 41 years of age. I also advised she may speak to father's and ask him to ask his gastroenterologist would advise earlier screening based on type of colon polyps. Patient will get back to me with how to proceed. Patient will schedule mammogram. Encouraged exercise.

## 2020-07-29 LAB — CYTOLOGY - PAP
Adequacy: ABSENT
Comment: NEGATIVE
Diagnosis: NEGATIVE
High risk HPV: NEGATIVE

## 2020-08-04 ENCOUNTER — Other Ambulatory Visit: Payer: Self-pay | Admitting: Family

## 2020-08-04 DIAGNOSIS — E559 Vitamin D deficiency, unspecified: Secondary | ICD-10-CM

## 2020-08-04 DIAGNOSIS — R899 Unspecified abnormal finding in specimens from other organs, systems and tissues: Secondary | ICD-10-CM

## 2020-08-04 MED ORDER — CHOLECALCIFEROL 1.25 MG (50000 UT) PO CAPS
ORAL_CAPSULE | ORAL | 0 refills | Status: DC
  Filled 2020-08-04: qty 8, fill #0
  Filled 2020-08-05: qty 8, 56d supply, fill #0

## 2020-08-05 ENCOUNTER — Other Ambulatory Visit: Payer: Self-pay

## 2020-08-14 ENCOUNTER — Other Ambulatory Visit (INDEPENDENT_AMBULATORY_CARE_PROVIDER_SITE_OTHER): Payer: 59

## 2020-08-14 ENCOUNTER — Other Ambulatory Visit: Payer: Self-pay

## 2020-08-14 DIAGNOSIS — R899 Unspecified abnormal finding in specimens from other organs, systems and tissues: Secondary | ICD-10-CM

## 2020-08-14 LAB — GAMMA GT: GGT: 11 U/L (ref 7–51)

## 2020-08-15 LAB — BILIRUBIN, FRACTIONATED(TOT/DIR/INDIR)
Bilirubin, Direct: 0.2 mg/dL (ref 0.0–0.2)
Indirect Bilirubin: 1 mg/dL (calc) (ref 0.2–1.2)
Total Bilirubin: 1.2 mg/dL (ref 0.2–1.2)

## 2020-10-06 ENCOUNTER — Encounter: Payer: Self-pay | Admitting: Family

## 2020-10-09 ENCOUNTER — Ambulatory Visit
Admission: RE | Admit: 2020-10-09 | Discharge: 2020-10-09 | Disposition: A | Discharge status: Home / Self Care | Payer: 59 | Source: Ambulatory Visit | Attending: Family | Admitting: Family

## 2020-10-09 ENCOUNTER — Other Ambulatory Visit: Payer: Self-pay

## 2020-10-09 DIAGNOSIS — R921 Mammographic calcification found on diagnostic imaging of breast: Secondary | ICD-10-CM | POA: Diagnosis not present

## 2020-10-09 DIAGNOSIS — Z0000 Encounter for general adult medical examination without abnormal findings: Secondary | ICD-10-CM

## 2020-10-09 DIAGNOSIS — N6323 Unspecified lump in the left breast, lower outer quadrant: Secondary | ICD-10-CM | POA: Diagnosis not present

## 2020-10-09 DIAGNOSIS — N6320 Unspecified lump in the left breast, unspecified quadrant: Secondary | ICD-10-CM | POA: Diagnosis present

## 2020-10-09 DIAGNOSIS — R922 Inconclusive mammogram: Secondary | ICD-10-CM | POA: Diagnosis not present

## 2020-10-15 ENCOUNTER — Telehealth: Payer: Self-pay

## 2020-10-15 NOTE — Telephone Encounter (Signed)
LM that patient's paperwork was ready to be picked up if that's what she would like. I asked for call back to confirm or mychart message. I have at my desk in patient paperwork folder.

## 2020-10-17 NOTE — Telephone Encounter (Signed)
I spoke with patient & have emailed her paperwork.

## 2020-10-17 NOTE — Telephone Encounter (Signed)
Pt called back returning your call.. She said that she sent a Mychart but would still like a call

## 2020-11-04 ENCOUNTER — Encounter: Payer: Self-pay | Admitting: Family

## 2021-01-19 ENCOUNTER — Other Ambulatory Visit: Payer: Self-pay

## 2021-01-19 MED ORDER — CARESTART COVID-19 HOME TEST VI KIT
PACK | 0 refills | Status: DC
  Filled 2021-01-19 – 2021-04-03 (×2): qty 2, 4d supply, fill #0

## 2021-01-29 ENCOUNTER — Encounter: Payer: Self-pay | Admitting: Family

## 2021-01-30 ENCOUNTER — Other Ambulatory Visit: Payer: Self-pay

## 2021-02-03 ENCOUNTER — Ambulatory Visit: Payer: 59 | Admitting: Family

## 2021-04-03 ENCOUNTER — Other Ambulatory Visit: Payer: Self-pay

## 2021-04-22 ENCOUNTER — Other Ambulatory Visit: Payer: Self-pay

## 2021-04-22 MED ORDER — CARESTART COVID-19 HOME TEST VI KIT
PACK | 0 refills | Status: AC
  Filled 2021-04-22: qty 2, 4d supply, fill #0

## 2021-06-25 ENCOUNTER — Other Ambulatory Visit: Payer: Self-pay

## 2021-06-25 ENCOUNTER — Other Ambulatory Visit: Payer: Self-pay | Admitting: Family

## 2021-06-25 DIAGNOSIS — J069 Acute upper respiratory infection, unspecified: Secondary | ICD-10-CM

## 2021-06-25 MED ORDER — FLUTICASONE PROPIONATE 50 MCG/ACT NA SUSP
2.0000 | Freq: Every day | NASAL | 6 refills | Status: DC
  Filled 2021-06-25: qty 16, 30d supply, fill #0

## 2021-06-25 MED ORDER — SUMATRIPTAN SUCCINATE 100 MG PO TABS
ORAL_TABLET | ORAL | 0 refills | Status: DC
  Filled 2021-06-25: qty 9, 30d supply, fill #0

## 2021-06-26 ENCOUNTER — Other Ambulatory Visit: Payer: Self-pay

## 2021-07-21 ENCOUNTER — Encounter: Payer: Self-pay | Admitting: Adult Health

## 2021-07-21 ENCOUNTER — Telehealth: Payer: 59 | Admitting: Adult Health

## 2021-07-21 ENCOUNTER — Other Ambulatory Visit: Payer: Self-pay

## 2021-07-21 ENCOUNTER — Encounter: Payer: Self-pay | Admitting: Family

## 2021-07-21 DIAGNOSIS — R051 Acute cough: Secondary | ICD-10-CM | POA: Insufficient documentation

## 2021-07-21 DIAGNOSIS — Z8709 Personal history of other diseases of the respiratory system: Secondary | ICD-10-CM | POA: Diagnosis not present

## 2021-07-21 DIAGNOSIS — J302 Other seasonal allergic rhinitis: Secondary | ICD-10-CM

## 2021-07-21 MED ORDER — BENZONATATE 100 MG PO CAPS
100.0000 mg | ORAL_CAPSULE | Freq: Two times a day (BID) | ORAL | 0 refills | Status: DC | PRN
  Filled 2021-07-21: qty 30, 15d supply, fill #0

## 2021-07-21 MED ORDER — FLUTICASONE PROPIONATE 50 MCG/ACT NA SUSP
2.0000 | Freq: Every day | NASAL | 6 refills | Status: DC
  Filled 2021-07-21: qty 16, 30d supply, fill #0
  Filled 2021-11-06: qty 16, 30d supply, fill #1
  Filled 2022-07-19: qty 16, 30d supply, fill #2

## 2021-07-21 MED ORDER — PREDNISONE 10 MG PO TABS
ORAL_TABLET | ORAL | 0 refills | Status: DC
  Filled 2021-07-21: qty 21, 6d supply, fill #0

## 2021-07-21 MED ORDER — LEVOCETIRIZINE DIHYDROCHLORIDE 5 MG PO TABS
5.0000 mg | ORAL_TABLET | Freq: Every evening | ORAL | 2 refills | Status: DC
  Filled 2021-07-21: qty 30, 30d supply, fill #0
  Filled 2021-11-06: qty 30, 30d supply, fill #1

## 2021-07-21 MED ORDER — AMOXICILLIN-POT CLAVULANATE 875-125 MG PO TABS
1.0000 | ORAL_TABLET | Freq: Two times a day (BID) | ORAL | 0 refills | Status: DC
  Filled 2021-07-21: qty 20, 10d supply, fill #0

## 2021-07-21 NOTE — Patient Instructions (Addendum)
Advised in person evaluation at anytime is advised if any symptoms do not improve, worsen or change at any given time.  ?Red Flags discussed. The patient was given clear instructions to go to ER or return to medical center if any red flags develop, symptoms do not improve, worsen or new problems develop. They verbalized understanding. ? ?Meds ordered this encounter  ?Medications  ? predniSONE (STERAPRED UNI-PAK 21 TAB) 10 MG (21) TBPK tablet  ?  Sig: PO: Take 6 tablets on day 1:Take 5 tablets day 2:Take 4 tablets day 3: Take 3 tablets day 4:Take 2 tablets day five: Take 1 tablet day 6  ?  Dispense:  21 tablet  ?  Refill:  0  ? amoxicillin-clavulanate (AUGMENTIN) 875-125 MG tablet  ?  Sig: Take 1 tablet by mouth 2 (two) times daily.  ?  Dispense:  20 tablet  ?  Refill:  0  ? benzonatate (TESSALON) 100 MG capsule  ?  Sig: Take 1 capsule (100 mg total) by mouth 2 (two) times daily as needed for cough.  ?  Dispense:  30 capsule  ?  Refill:  0  ? levocetirizine (XYZAL) 5 MG tablet  ?  Sig: Take 1 tablet (5 mg total) by mouth every evening.  ?  Dispense:  30 tablet  ?  Refill:  2  ? fluticasone (FLONASE) 50 MCG/ACT nasal spray  ?  Sig: Place 2 sprays into both nostrils daily.  ?  Dispense:  16 g  ?  Refill:  6  ?  ? ?Medications Discontinued During This Encounter  ?Medication Reason  ? fexofenadine (ALLEGRA) 180 MG tablet Completed Course  ? fluticasone (FLONASE) 50 MCG/ACT nasal spray   ? fluticasone (FLONASE) 50 MCG/ACT nasal spray Reorder  ? Cough, Adult ?A cough helps to clear your throat and lungs. A cough may be a sign of an illness or another medical condition. ?An acute cough may only last 2-3 weeks, while a chronic cough may last 8 or more weeks. ?Many things can cause a cough. They include: ?Germs (viruses or bacteria) that attack the airway. ?Breathing in things that bother (irritate) your lungs. ?Allergies. ?Asthma. ?Mucus that runs down the back of your throat (postnasal drip). ?Smoking. ?Acid backing up from  the stomach into the tube that moves food from the mouth to the stomach (gastroesophageal reflux). ?Some medicines. ?Lung problems. ?Other medical conditions, such as heart failure or a blood clot in the lung (pulmonary embolism). ?Follow these instructions at home: ?Medicines ?Take over-the-counter and prescription medicines only as told by your doctor. ?Talk with your doctor before you take medicines that stop a cough (cough suppressants). ?Lifestyle ? ?Do not smoke, and try not to be around smoke. Do not use any products that contain nicotine or tobacco, such as cigarettes, e-cigarettes, and chewing tobacco. If you need help quitting, ask your doctor. ?Drink enough fluid to keep your pee (urine) pale yellow. ?Avoid caffeine. ?Do not drink alcohol if your doctor tells you not to drink. ?General instructions ? ?Watch for any changes in your cough. Tell your doctor about them. ?Always cover your mouth when you cough. ?Stay away from things that make you cough, such as perfume, candles, campfire smoke, or cleaning products. ?If the air is dry, use a cool mist vaporizer or humidifier in your home. ?If your cough is worse at night, try using extra pillows to raise your head up higher while you sleep. ?Rest as needed. ?Keep all follow-up visits as told by your  doctor. This is important. ?Contact a doctor if: ?You have new symptoms. ?You cough up pus. ?Your cough does not get better after 2-3 weeks, or your cough gets worse. ?Cough medicine does not help your cough and you are not sleeping well. ?You have pain that gets worse or pain that is not helped with medicine. ?You have a fever. ?You are losing weight and you do not know why. ?You have night sweats. ?Get help right away if: ?You cough up blood. ?You have trouble breathing. ?Your heartbeat is very fast. ?These symptoms may be an emergency. Do not wait to see if the symptoms will go away. Get medical help right away. Call your local emergency services (911 in the  [place] Do not drive yourself to the hospital. ?Summary ?A cough helps to clear your throat and lungs. Many things can cause a cough. ?Take over-the-counter and prescription medicines only as told by your doctor. ?Always cover your mouth when you cough. ?Contact a doctor if you have new symptoms or you have a cough that does not get better or gets worse. ?This information is not intended to replace advice given to you by your health care provider. Make sure you discuss any questions you have with your health care provider. ?Document Revised: 06/08/2019 Document Reviewed: 05/08/2018 ?Elsevier Patient Education ? 2022 [place]. ? ? ?

## 2021-07-21 NOTE — Progress Notes (Signed)
Virtual Visit via Video Note ? ?I connected with Kara Vaughn on 07/21/21 at  2:00 PM EDT by a video enabled telemedicine application and verified that I am speaking with the correct person using two identifiers. ? ?Location: ?Patient: at home  ?Provider: Provider: Provider's office at  [facility], [place]. ?  ? ?  ?I discussed the limitations of evaluation and management by telemedicine and the availability of in person appointments. The patient expressed understanding and agreed to proceed. ? ?History of Present Illness: ?Patient is a 41 year old female in no acute distress who comes by video for viist, she has had a cough for 1 week.  ? Mild congestion and drainage.  ?She has been taking Mucinex.  ?Denies any sinus pressure or pain.  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  ?Denies any wheezing, orthopnea, and no edema.  ?Covid test at home is negative.  ?Denies any sore throat.  ?She denies any history of pneumonia.  ?She does not feel bad the cough is just irritating.  ? ?She is going camping this weekend.  ?At home covid test was negative.  ?No LMP recorded. (Menstrual status: IUD).denies any pregnancy.  ?Denies any abdominal pain, chest pain, or diaphoresis.  ?Patient  denies any fever, body aches,chills, rash, chest pain, shortness of breath, nausea, vomiting, or diarrhea.  ?Denies dizziness, lightheadedness, pre syncopal or syncopal episodes.  ? ? ?  ?Observations/Objective: ? ?Patient is alert and oriented and responsive to questions Engages in conversation with provider. Speaks in full sentences without any pauses without any shortness of breath or distress.   ?Assessment and Plan: ? ?1. Acute cough ?Offered covid, flu, strep testing here at office as well as chest x ray and cbc, cmp, she politely declines at this time, she prefers to try treatment for possible bacterial infection, suspect at this point still viral, however  given duration of symptoms will send  Augmentin in case no improvement within 2 days she should start. Recommend starting prednisone and benzonatate as prescribed.  ? ?- predniSONE (STERAPRED UNI-PAK 21 TAB) 10 MG (21) TBPK tablet; PO: Take 6 tablets on day 1:Take 5 tablets day 2:Take 4 tablets day 3: Take 3 tablets day 4:Take 2 tablets day five: Take 1 tablet day 6  Dispense: 21 tablet; Refill: 0 ?- amoxicillin-clavulanate (AUGMENTIN) 875-125 MG tablet; Take 1 tablet by mouth 2 (two) times daily.  Dispense: 20 tablet; Refill: 0 ?- benzonatate (TESSALON) 100 MG capsule; Take 1 capsule (100 mg total) by mouth 2 (two) times daily as needed for cough.  Dispense: 30 capsule; Refill: 0 ? ?2. Seasonal allergies ?Discontinue the allegra and try Xyzal and also continue Flonase nasal spray as directed.  ?- levocetirizine (XYZAL) 5 MG tablet; Take 1 tablet (5 mg total) by mouth every evening.  Dispense: 30 tablet; Refill: 2 ?- fluticasone (FLONASE) 50 MCG/ACT nasal spray; Place 2 sprays into both nostrils daily.  Dispense: 16 g; Refill: 6 ? ?3. History of URI (upper respiratory infection) ?Suspect that the patient has had viral respiratory infection.  ? ?Follow Up Instructions: ?Advised to follow up in office or medical facility  for any worsening symptoms at anytime.  ? Advised in person evaluation at anytime is advised if any symptoms do not improve, worsen or change at any given time.  ?Red Flags discussed. The patient was given clear instructions to go to ER or return to medical center if any red flags develop, symptoms do not improve, worsen or  new problems develop. They verbalized understanding. ? ?I discussed the assessment and treatment plan with the patient. The patient was provided an opportunity to ask questions and all were answered. The patient agreed with the plan and demonstrated an understanding of the instructions. ?  ?The patient was advised to call back or seek an in-person evaluation if the symptoms worsen or if the condition fails to improve  as anticipated. ? ?Return in about 3 days (around 07/24/2021), or if symptoms worsen or fail to improve, for at any time for any worsening symptoms, Go to Emergency room/ urgent care if worse.  ? ? ?Marcille Buffy, FNP  ?

## 2021-07-29 ENCOUNTER — Other Ambulatory Visit: Payer: Self-pay

## 2021-07-29 ENCOUNTER — Other Ambulatory Visit (HOSPITAL_COMMUNITY)
Admission: RE | Admit: 2021-07-29 | Discharge: 2021-07-29 | Disposition: A | Discharge status: Home / Self Care | Payer: 59 | Source: Ambulatory Visit | Attending: Family | Admitting: Family

## 2021-07-29 ENCOUNTER — Encounter: Payer: Self-pay | Admitting: Family

## 2021-07-29 ENCOUNTER — Ambulatory Visit (INDEPENDENT_AMBULATORY_CARE_PROVIDER_SITE_OTHER): Payer: 59 | Admitting: Family

## 2021-07-29 VITALS — BP 128/80 | HR 72 | Temp 98.5°F | Ht 69.0 in | Wt 252.1 lb

## 2021-07-29 DIAGNOSIS — Z124 Encounter for screening for malignant neoplasm of cervix: Secondary | ICD-10-CM

## 2021-07-29 DIAGNOSIS — Z0000 Encounter for general adult medical examination without abnormal findings: Secondary | ICD-10-CM

## 2021-07-29 LAB — COMPREHENSIVE METABOLIC PANEL
ALT: 17 U/L (ref 0–35)
AST: 15 U/L (ref 0–37)
Albumin: 4.6 g/dL (ref 3.5–5.2)
Alkaline Phosphatase: 51 U/L (ref 39–117)
BUN: 11 mg/dL (ref 6–23)
CO2: 29 mEq/L (ref 19–32)
Calcium: 9.2 mg/dL (ref 8.4–10.5)
Chloride: 101 mEq/L (ref 96–112)
Creatinine, Ser: 0.77 mg/dL (ref 0.40–1.20)
GFR: 95.7 mL/min (ref >60.00)
Glucose, Bld: 83 mg/dL (ref 70–99)
Potassium: 4.2 mEq/L (ref 3.5–5.1)
Sodium: 139 mEq/L (ref 135–145)
Total Bilirubin: 0.9 mg/dL (ref 0.2–1.2)
Total Protein: 6.6 g/dL (ref 6.0–8.3)

## 2021-07-29 LAB — CBC WITH DIFFERENTIAL/PLATELET
Basophils Absolute: 0.1 10*3/uL (ref 0.0–0.1)
Basophils Relative: 0.7 % (ref 0.0–3.0)
Eosinophils Absolute: 0.4 10*3/uL (ref 0.0–0.7)
Eosinophils Relative: 4.2 % (ref 0.0–5.0)
HCT: 41.9 % (ref 36.0–46.0)
Hemoglobin: 14.2 g/dL (ref 12.0–15.0)
Lymphocytes Relative: 18.9 % (ref 12.0–46.0)
Lymphs Abs: 1.8 10*3/uL (ref 0.7–4.0)
MCHC: 34 g/dL (ref 30.0–36.0)
MCV: 88.8 fl (ref 78.0–100.0)
Monocytes Absolute: 0.8 10*3/uL (ref 0.1–1.0)
Monocytes Relative: 8.1 % (ref 3.0–12.0)
Neutro Abs: 6.4 10*3/uL (ref 1.4–7.7)
Neutrophils Relative %: 68.1 % (ref 43.0–77.0)
Platelets: 277 10*3/uL (ref 150.0–400.0)
RBC: 4.72 Mil/uL (ref 3.87–5.11)
RDW: 13.6 % (ref 11.5–15.5)
WBC: 9.4 10*3/uL (ref 4.0–10.5)

## 2021-07-29 LAB — LIPID PANEL
Cholesterol: 151 mg/dL (ref 0–200)
HDL: 61.9 mg/dL (ref >39.00)
LDL Cholesterol: 69 mg/dL (ref 0–99)
NonHDL: 89.56
Total CHOL/HDL Ratio: 2
Triglycerides: 104 mg/dL (ref 0.0–149.0)
VLDL: 20.8 mg/dL (ref 0.0–40.0)

## 2021-07-29 LAB — VITAMIN D 25 HYDROXY (VIT D DEFICIENCY, FRACTURES): VITD: 19.84 ng/mL — ABNORMAL LOW (ref 30.00–100.00)

## 2021-07-29 LAB — TSH: TSH: 1.21 u[IU]/mL (ref 0.35–5.50)

## 2021-07-29 LAB — HEMOGLOBIN A1C: Hgb A1c MFr Bld: 5.7 % (ref 4.6–6.5)

## 2021-07-29 NOTE — Progress Notes (Signed)
? ?Subjective:  ? ? Patient ID: Kara Vaughn, female    DOB: 13-Oct-1980, 41 y.o.   MRN: [id number] ? ?CC: Kara Vaughn is a 41 y.o. female who presents today for physical exam.   ? ?HPI: She feels well today.  No new complaints ? ? ?Colorectal Cancer Screening: no FDR with colon cancer.  Father does have history of colon polyps. ? ?Breast Cancer Screening: Mammogram UTD, bilateral diagnostic and left breast ultrasound due June 2023 ? ?Cervical Cancer Screening: due as transformation zone absent 2022; she has an IUD ? ? ?      Tetanus - UTD ?       ?Labs: Screening labs today. ?Exercise: No regular exercise during the winter months; plans to start exercising more in the spring.   ?Alcohol use:  rarely ?Smoking/tobacco use: Nonsmoker.   ?Follows annually with dermatology.  ? ? ?HISTORY:  ?Past Medical History:  ?Diagnosis Date  ? Family history of BRCA2 gene positive   ? Family history of breast cancer   ? Family history of colon cancer   ? Family history of colonic polyps   ? Family history of esophageal cancer   ? Family history of Lynch syndrome   ? HPV test positive 2001  ? Migraine   ?  ?Past Surgical History:  ?Procedure Laterality Date  ? BREAST BIOPSY Right 10/17/2017  ? pseudoangiomatous stromal hyperplasia   ? CYST REMOVAL TRUNK  07/2012  ? [facility]  ? VAGINAL DELIVERY    ? 3  ? ?Family History  ?Problem Relation Age of Onset  ? Thyroid disease Father   ? Heart disease Father   ?     2011, MI  ? Colon polyps Father   ? Thyroid disease Sister   ? Cancer Maternal Aunt 70  ?     colon cancer  ? Cancer Paternal Aunt   ?     esophageal  ? COPD Maternal Grandfather   ? Cancer Paternal Grandmother 43  ?     ?uterine cancer, breast cancer, bone cancer  ? Breast cancer Paternal Grandmother 66  ? Sudden Cardiac Death Paternal Grandfather 30  ? BRCA 1/2 Cousin   ?     BRCA2+, half paternal cousin  ? Other Cousin   ?     Lynch syndrome (PMS2+)  ? ?  ? ?ALLERGIES: Patient has no known allergies. ? ?Current Outpatient  Medications on File Prior to Visit  ?Medication Sig Dispense Refill  ? amoxicillin-clavulanate (AUGMENTIN) 875-125 MG tablet Take 1 tablet by mouth 2 (two) times daily. 20 tablet 0  ? benzonatate (TESSALON) 100 MG capsule Take 1 capsule (100 mg total) by mouth 2 (two) times daily as needed for cough. 30 capsule 0  ? Cholecalciferol 1.25 MG (50000 UT) capsule TAKE 1 CAPSULE BY MOUTH EVERY WEEK FOR 8 WEEKS 8 capsule 0  ? COVID-19 At Home Antigen Test [facility] COVID-19 HOME TEST) KIT use as directed 2 kit 0  ? fluticasone (FLONASE) 50 MCG/ACT nasal spray Place 2 sprays into both nostrils daily. 16 g 6  ? levocetirizine (XYZAL) 5 MG tablet Take 1 tablet (5 mg total) by mouth every evening. 30 tablet 2  ? levonorgestrel (MIRENA) 20 MCG/24HR IUD 1 each by Intrauterine route once.    ? Multiple Vitamin (MULTIVITAMIN) tablet Take 1 tablet by mouth daily.    ? predniSONE (DELTASONE) 10 MG tablet Take 6 tablets by mouth on day 1, then decrease by one tablet each day 21 tablet 0  ?  SUMAtriptan (IMITREX) 100 MG tablet TAKE 1 TABLET BY MOUTH ONCE FOR 1 DOSE. MAY REPEAT DOSE AFTER 2 HOURS IF HEADACHE PERSISTS OR RECURS. 9 tablet 0  ? ?No current facility-administered medications on file prior to visit.  ? ? ?Social History  ? ?Tobacco Use  ? Smoking status: Never  ? Smokeless tobacco: Never  ?Substance Use Topics  ? Alcohol use: Yes  ?  Comment: Rarely  ? Drug use: No  ? ? ?Review of Systems  ?Constitutional:  Negative for chills, fever and unexpected weight change.  ?HENT:  Negative for congestion.   ?Respiratory:  Negative for cough.   ?Cardiovascular:  Negative for chest pain, palpitations and leg swelling.  ?Gastrointestinal:  Negative for nausea and vomiting.  ?Musculoskeletal:  Negative for arthralgias and myalgias.  ?Skin:  Negative for rash.  ?Neurological:  Negative for headaches.  ?Hematological:  Negative for adenopathy.  ?Psychiatric/Behavioral:  Negative for confusion.   ?   ?Objective:  ?  ?BP 128/80 (BP Location:  Left Arm, Patient Position: Sitting, Cuff Size: Large)   Pulse 72   Temp 98.5 ?F (36.9 ?C) (Oral)   Ht $R'5\' 9"'ej$  (1.753 m)   Wt 252 lb 1.6 oz (114.4 kg)   LMP  (LMP Unknown)   SpO2 98%   BMI 37.23 kg/m?  ? ?BP Readings from Last 3 Encounters:  ?07/29/21 128/80  ?07/28/20 102/60  ?12/04/19 112/76  ? ?Wt Readings from Last 3 Encounters:  ?07/29/21 252 lb 1.6 oz (114.4 kg)  ?07/28/20 246 lb 6.4 oz (111.8 kg)  ?12/04/19 235 lb 3.2 oz (106.7 kg)  ? ? ?Physical Exam ?Vitals reviewed.  ?Constitutional:   ?   Appearance: Normal appearance. She is well-developed.  ?Eyes:  ?   Conjunctiva/sclera: Conjunctivae normal.  ?Neck:  ?   Thyroid: No thyroid mass or thyromegaly.  ?Cardiovascular:  ?   Rate and Rhythm: Normal rate and regular rhythm.  ?   Pulses: Normal pulses.  ?   Heart sounds: Normal heart sounds.  ?Pulmonary:  ?   Effort: Pulmonary effort is normal.  ?   Breath sounds: Normal breath sounds. No wheezing, rhonchi or rales.  ?Chest:  ?Breasts: ?   Breasts are symmetrical.  ?   Right: No inverted nipple, mass, nipple discharge, skin change or tenderness.  ?   Left: No inverted nipple, mass, nipple discharge, skin change or tenderness.  ?Abdominal:  ?   General: Bowel sounds are normal. There is no distension.  ?   Palpations: Abdomen is soft. Abdomen is not rigid. There is no fluid wave or mass.  ?   Tenderness: There is no abdominal tenderness. There is no guarding or rebound.  ?Genitourinary: ?   Cervix: No cervical motion tenderness, discharge or friability.  ?   Uterus: Not enlarged, not fixed and not tender.   ?   Adnexa:     ?   Right: No mass, tenderness or fullness.      ?   Left: No mass, tenderness or fullness.    ?   Comments: Two IUD strings seen coming from cervical os.  ?pap performed. No CMT. Unable to appreciated ovaries. ?Lymphadenopathy:  ?   Head:  ?   Right side of head: No submental, submandibular, tonsillar, preauricular, posterior auricular or occipital adenopathy.  ?   Left side of head: No  submental, submandibular, tonsillar, preauricular, posterior auricular or occipital adenopathy.  ?   Cervical:  ?   Right cervical: No superficial, deep or posterior  cervical adenopathy. ?   Left cervical: No superficial, deep or posterior cervical adenopathy.  ?   Upper Body:  ?   Right upper body: No pectoral adenopathy.  ?   Left upper body: No pectoral adenopathy.  ?Skin: ?   General: Skin is warm and dry.  ?Neurological:  ?   Mental Status: She is alert.  ?Psychiatric:     ?   Speech: Speech normal.     ?   Behavior: Behavior normal.     ?   Thought Content: Thought content normal.  ? ? ?   ?Assessment & Plan:  ? ?Problem List Items Addressed This Visit   ? ?  ? Other  ? Routine general medical examination at a health care facility - Primary  ?  Clinical breast exam and Pap smear performed today.  Discussed with patient father's history of colon polyps.  He follows with [facility] gastroenterology.  Patient will send me his previous colonoscopy report with his permission so I can call [facility] gastroenterology and seek their advice on whether patient should start colonoscopy sooner than 41 years of age. ?  ?  ? Relevant Orders  ? CBC w/Diff  ? Lipid panel  ? TSH  ? HgB A1c  ? Vitamin D (25 hydroxy)  ? Comp Met (CMET)  ? MM DIAG BREAST TOMO BILATERAL  ? US BREAST LTD UNI LEFT INC AXILLA  ? ?Other Visit Diagnoses   ? ? Screening for cervical cancer      ? Relevant Orders  ? Cytology - PAP( [place])  ? ?  ? ? ? ?I am having Callista Hoh maintain her multivitamin, levonorgestrel, Cholecalciferol, Carestart COVID-19 Home Test, SUMAtriptan, predniSONE, amoxicillin-clavulanate, benzonatate, levocetirizine, and fluticasone. ? ? ?No orders of the defined types were placed in this encounter. ? ? ?Return precautions given.  ? ?Risks, benefits, and alternatives of the medications and treatment plan prescribed today were discussed, and patient expressed understanding.  ? ?Education regarding symptom management and  diagnosis given to patient on AVS.  ? ?Continue to follow with Burnard Hawthorne, FNP for routine health maintenance.  ? ?Donaciano Eva and I agreed with plan.  ? ?Mable Paris, FNP ? ? ? ?

## 2021-07-29 NOTE — Assessment & Plan Note (Signed)
Clinical breast exam and Pap smear performed today.  Discussed with patient father's history of colon polyps.  He follows with [facility] gastroenterology.  Patient will send me his previous colonoscopy report with his permission so I can call [facility] gastroenterology and seek their advice on whether patient should start colonoscopy sooner than 41 years of age. ?

## 2021-07-29 NOTE — Patient Instructions (Addendum)
Please call  and schedule your 3D mammogram and /or bone density scan as we discussed. ? ? [facility]  ( new location in 2023) ? ?[address], [place], [place] [address] ? ?[place], [place]  ?[phone] ? ? ?As discussed today regarding colon cancer risk ( CRC)  and your family history of polyps.  ? ?Typically we start screening at 42  years old for AVERAGE risk people.  ? ?We assess the risk for CRC at the initial visit for an adult who is age 14 years or older to identify high-risk patients who should begin CRC screening at an earlier age than those at average risk. Subsequent reassessment every three to five years identifies whether the patient or family members have developed factors that raise the patient?s level of risk for CRC ? ?Factors important to determine CRC risk can be assessed by asking several questions. A "no" response to all of these questions generally indicates AVERAGE risk. ? ??Have you ever had CRC or an adenomatous polyp? A personal history of CRC increases the risk of another primary (metachronous) cancer. Surveillance following CRC is discussed separately. A personal history of adenomatous colorectal polyps increases the risk of CRC . The number and types of polyp lesions guide the determination of the appropriate interval for surveillance. ? ?If there is uncertainty about the patient?s personal polyp history, records should be obtained to determine if the patient had an adenomatous polyp. If records cannot be obtained, the patient may recall being told a polyp was found and advised to have a follow-up colonoscopy in five years or sooner, suggesting the polyp was adenomatous. ? ??Have any family members had CRC or a documented advanced polyp? An advanced polyp is defined as an advanced adenoma (adenoma ?1 cm, or with high-grade dysplasia, or with villous elements) or advanced serrated lesion (sessile serrated polyp [SSP] ?1 cm, or traditional serrated adenoma ?1  cm, or SSP with cytologic dysplasia). ? ?The [place] Multi-Society Task Force (MSTF) on Colorectal Cancer recommends that, if there is documentation that an FDR had an advanced adenoma (adenoma ?1 cm, or with high-grade dysplasia, or with villous elements) or polyp requiring surgical excision, this should be weighted the same as having an FDR with CRC when suggesting screening programs [20]. The yield of screening colonoscopy in patients with FDRs who had advanced adenomas is substantially increased. However, the MSTF no longer recommends that patients undergo intensified colon screening without clear documentation that an FDR?s polyp was advanced; in the absence of documentation that an FDR?s polyp was advanced, it should be assumed it was not advanced ? ? ?If so, how many family members, were they first-degree relatives (parent, sibling, or child), and at what age was the cancer or polyp first diagnosed? Enhanced screening is warranted for a patient with increased risk of CRC due to a family history of CRC or documented advanced polyp and is described in detail separately. ?If a family member is reported to have had a polyp but there is lack of available documentation about the type of polyp, the patient is typically screened as if a family member did not have an advanced polyp. ?If the patient cannot obtain any family history whatsoever related to colorectal cancer or polyps, some experts suggest screening the patient as average risk, although there are no data that evaluate that approach. ??Do you have family members with any of the known genetic syndromes ( such as Lynch Syndrome) that can cause CRC? Patients with a family  history of a known genetic syndrome for CRC may require enhanced screening plus genetic counseling.  ? ?Obtaining family history permits determination of the number of First Degree Relatives( FDR) diagnosed with ANY of the following: ??Colorectal cancer (CRC) ??Documented advanced polyp with ANY  of the following: ?Advanced adenoma ?-Adenoma size ?1 cm ?-Adenoma with high-grade dysplasia ?-Adenoma with villous elements ?Advanced serrated lesion ?-Sessile serrated polyp (SSP) ?1 cm ?-Traditional serrated adenoma ?1 cm ?-SSP with cytologic dysplasia ? ?We suggest using the information about first-degree relatives (FDRs) with ANY of the above to screen as follows: ??One FDR diagnosed at age <60 years - Begin screening at age 102 years, or 10 years before the FDR's diagnosis, whichever is earlier. We suggest colonoscopy every five years. If the patient declines colonoscopy, annual fecal immunochemical testing (FIT) should be offered. ??Two or more FDRs diagnosed at any age - Begin screening at age 32 years, or 10 years before the youngest FDR's diagnosis, whichever is earlier. We suggest colonoscopy every five years. If the patient declines colonoscopy, annual FIT should be offered. ??One FDR diagnosed at age ?45 years - Begin screening at age 61 years, using the same screening options as for average-risk patients, at the same frequency as for average-risk patients. ?If the only family history is an FDR with a polyp NOT clearly documented as an advanced adenoma or serrated lesion, we suggest that the patient be screened as an average-risk patient, because of the potential inaccuracy of the family history. This is in contrast to patients whose FDR has a documented history of an advanced adenoma or serrated lesion, who should be screened similarly to those with a family history of CRC, as indicated above.  ?These suggestions are in keeping with the 2017 guidelines from the [place] Multi-Society Task Force (MSTF) on Colorectal Cancer, issued collaboratively on behalf of the [employer], the [employer], and the [facility]  ? ?Health Maintenance, Female ?Adopting a healthy lifestyle and getting preventive care are important in  promoting health and wellness. Ask your health care provider about: ?The right schedule for you to have regular tests and exams. ?Things you can do on your own to prevent diseases and keep yourself healthy. ?What should I know about diet, weight, and exercise? ?Eat a healthy diet ? ?Eat a diet that includes plenty of vegetables, fruits, low-fat dairy products, and lean protein. ?Do not eat a lot of foods that are high in solid fats, added sugars, or sodium. ?Maintain a healthy weight ?Body mass index (BMI) is used to identify weight problems. It estimates body fat based on height and weight. Your health care provider can help determine your BMI and help you achieve or maintain a healthy weight. ?Get regular exercise ?Get regular exercise. This is one of the most important things you can do for your health. Most adults should: ?Exercise for at least 150 minutes each week. The exercise should increase your heart rate and make you sweat (moderate-intensity exercise). ?Do strengthening exercises at least twice a week. This is in addition to the moderate-intensity exercise. ?Spend less time sitting. Even light physical activity can be beneficial. ?Watch cholesterol and blood lipids ?Have your blood tested for lipids and cholesterol at 42 years of age, then have this test every 5 years. ?Have your cholesterol levels checked more often if: ?Your lipid or cholesterol levels are high. ?You are older than 42 years of age. ?You are at high risk for heart disease. ?What should  I know about cancer screening? ?Depending on your health history and family history, you may need to have cancer screening at various ages. This may include screening for: ?Breast cancer. ?Cervical cancer. ?Colorectal cancer. ?Skin cancer. ?Lung cancer. ?What should I know about heart disease, diabetes, and high blood pressure? ?Blood pressure and heart disease ?High blood pressure causes heart disease and increases the risk of stroke. This is more likely  to develop in people who have high blood pressure readings or are overweight. ?Have your blood pressure checked: ?Every 3-5 years if you are 77-69 years of age. ?Every year if you are 40 years old or older.

## 2021-07-31 LAB — CYTOLOGY - PAP
Comment: NEGATIVE
Diagnosis: NEGATIVE
High risk HPV: NEGATIVE

## 2021-08-02 ENCOUNTER — Other Ambulatory Visit: Payer: Self-pay | Admitting: Family

## 2021-08-02 DIAGNOSIS — B379 Candidiasis, unspecified: Secondary | ICD-10-CM

## 2021-08-02 DIAGNOSIS — E559 Vitamin D deficiency, unspecified: Secondary | ICD-10-CM

## 2021-08-02 MED ORDER — FLUCONAZOLE 150 MG PO TABS
150.0000 mg | ORAL_TABLET | Freq: Once | ORAL | 1 refills | Status: AC
  Filled 2021-08-02: qty 2, 2d supply, fill #0

## 2021-08-02 MED ORDER — CHOLECALCIFEROL 1.25 MG (50000 UT) PO CAPS
ORAL_CAPSULE | ORAL | 0 refills | Status: DC
  Filled 2021-08-02: qty 8, 56d supply, fill #0

## 2021-08-03 ENCOUNTER — Other Ambulatory Visit: Payer: Self-pay

## 2021-08-03 ENCOUNTER — Encounter: Payer: Self-pay | Admitting: Family

## 2021-08-04 ENCOUNTER — Telehealth: Payer: Self-pay | Admitting: Family

## 2021-08-04 ENCOUNTER — Other Ambulatory Visit: Payer: Self-pay

## 2021-08-04 ENCOUNTER — Encounter: Payer: Self-pay | Admitting: Family

## 2021-08-04 DIAGNOSIS — T783XXA Angioneurotic edema, initial encounter: Secondary | ICD-10-CM

## 2021-08-04 MED ORDER — EPINEPHRINE 0.3 MG/0.3ML IJ SOAJ
0.3000 mg | INTRAMUSCULAR | 1 refills | Status: DC | PRN
  Filled 2021-08-04: qty 2, 30d supply, fill #0

## 2021-08-04 NOTE — Telephone Encounter (Signed)
Spoke to patient about paperwok  and patient verbalized understanding and will come back to pick up form today ?

## 2021-08-04 NOTE — Telephone Encounter (Signed)
Call pt ?Completed scout paperwork ?Due to BMI she is not allowed to participate in high adventure activity that will take you take for more than 30 minutes away from emergency vehicle per paperwork ? ?Advised patient that I refilled EpiPen patches history of angioedema but I would advise her to carry this with her at all times as we were never able to identify the reason for angioedema nor did she follow-up with allergy. ? ?Ensure she understands the above and I have completed  ?

## 2021-08-20 IMAGING — MG DIGITAL DIAGNOSTIC BILAT W/ TOMO W/ CAD
8 series · 8 of 24 positions shown · non-contrast
Comparison: Previous exam(s).

CLINICAL DATA: BI-RADS 3 follow-up of a LEFT breast mass, initiated
April 2019. Patient has completed BI-RADS 3 follow-up of
calcifications and RIGHT breast masses.

EXAM:
DIGITAL DIAGNOSTIC BILATERAL MAMMOGRAM WITH TOMOSYNTHESIS AND CAD;
ULTRASOUND LEFT BREAST LIMITED
TECHNIQUE: Bilateral digital diagnostic mammography and breast tomosynthesis
was performed. The images were evaluated with computer-aided
detection.; Targeted ultrasound examination of the left breast was
performed

[R MLO synth-2D]
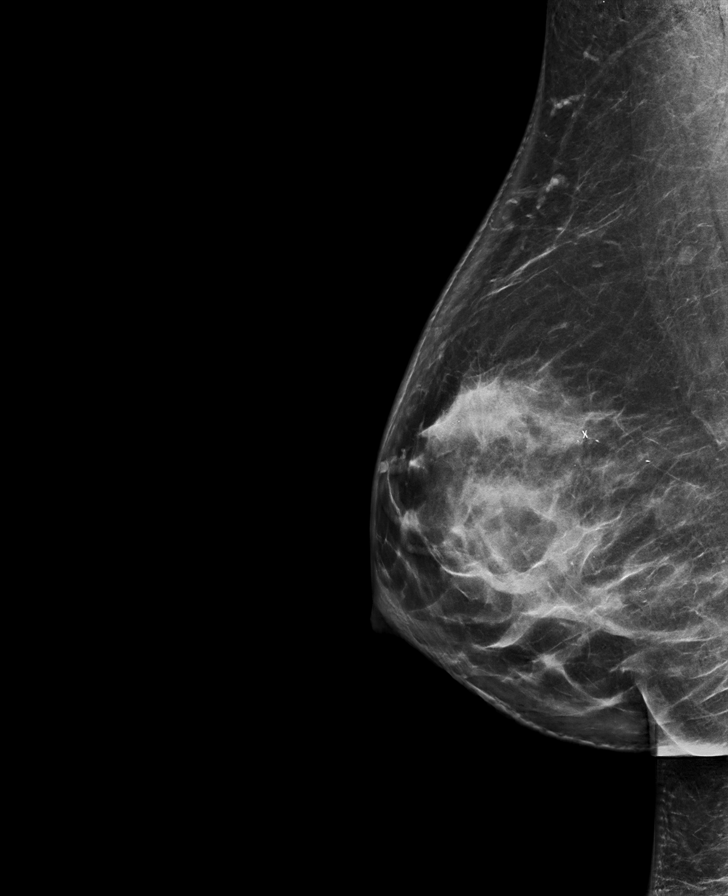

[L CC synth-2D]
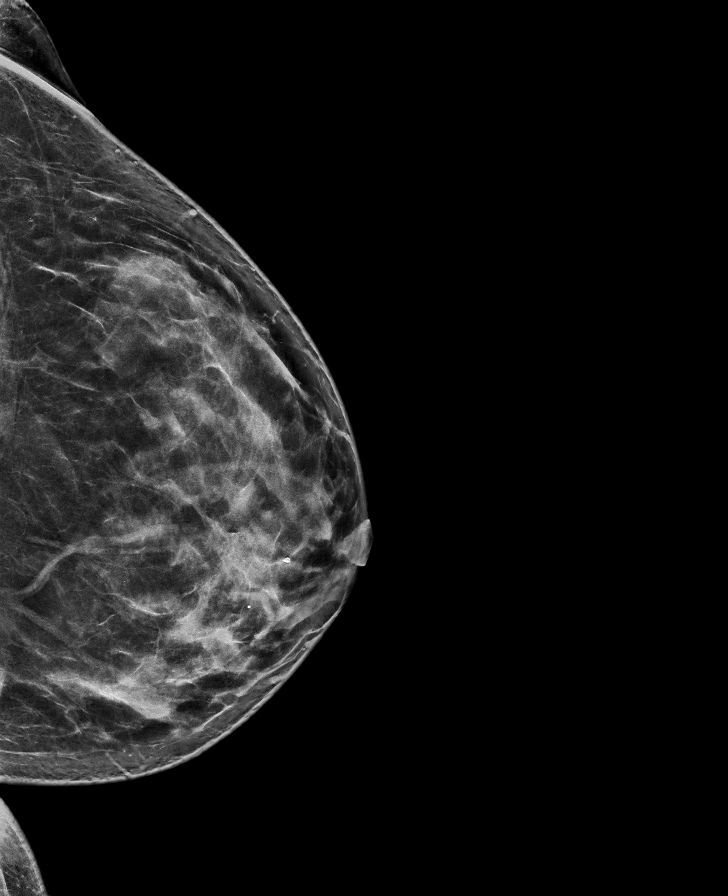

[R CC synth-2D]
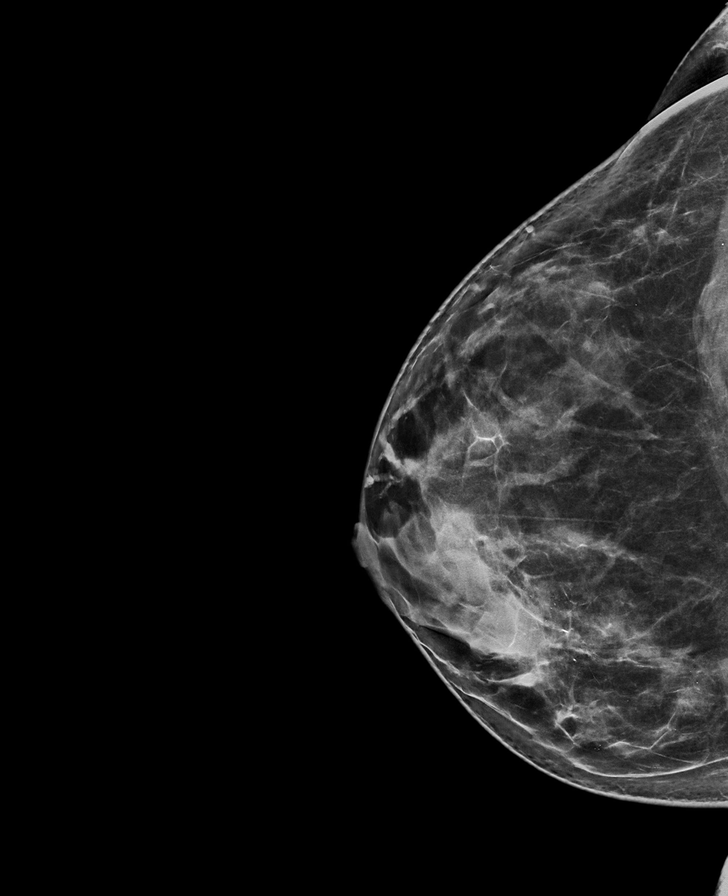

[L MLO synth-2D]
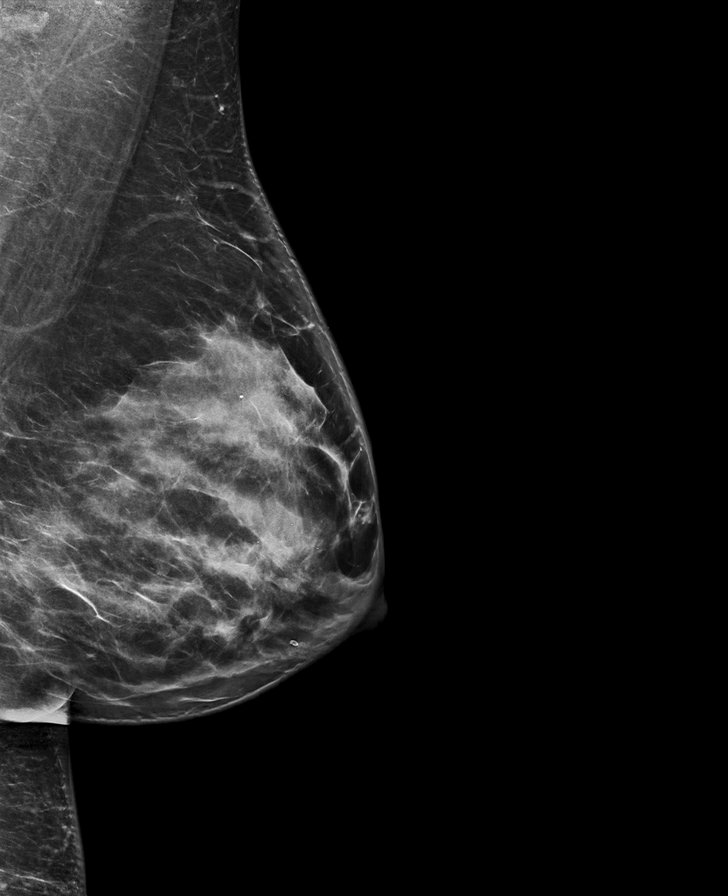

[R MLO tomo · tomo slice 49/97.0]
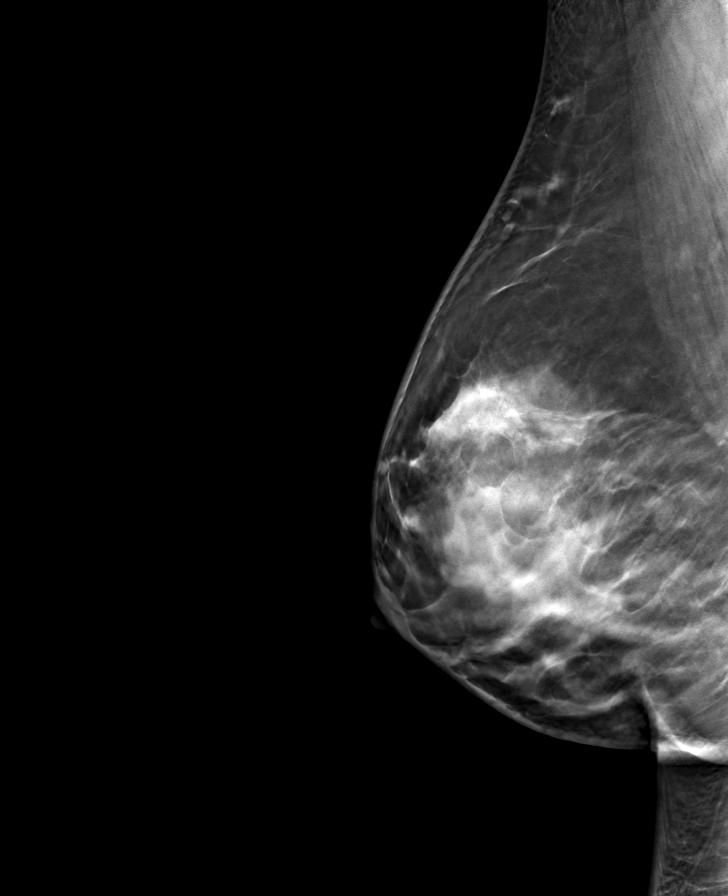

[R CC tomo · tomo slice 43/84.0]
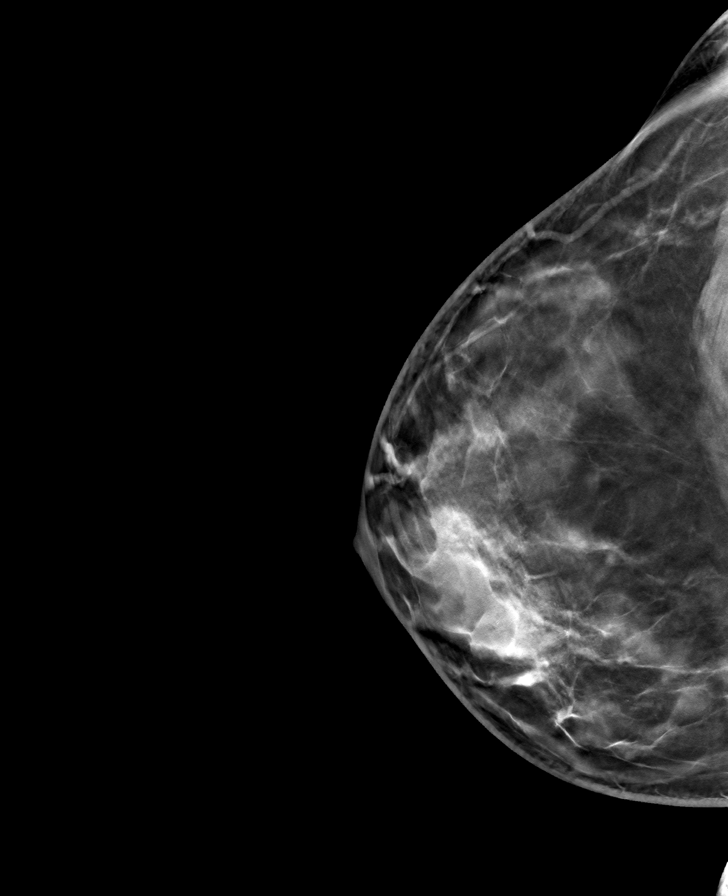

[L CC tomo · tomo slice 41/81.0]
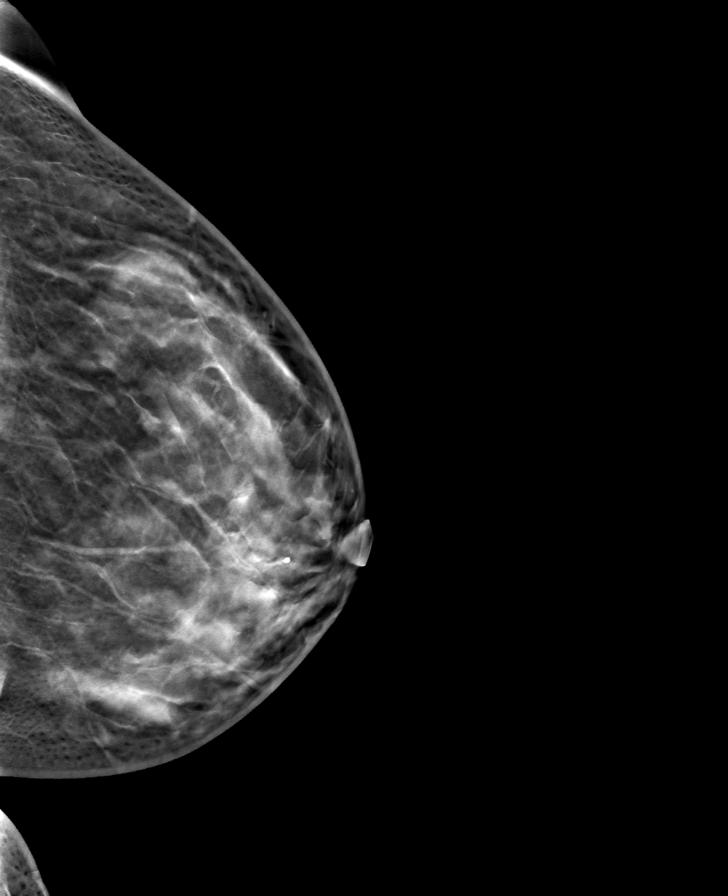

[L MLO tomo · tomo slice 45/89.0]
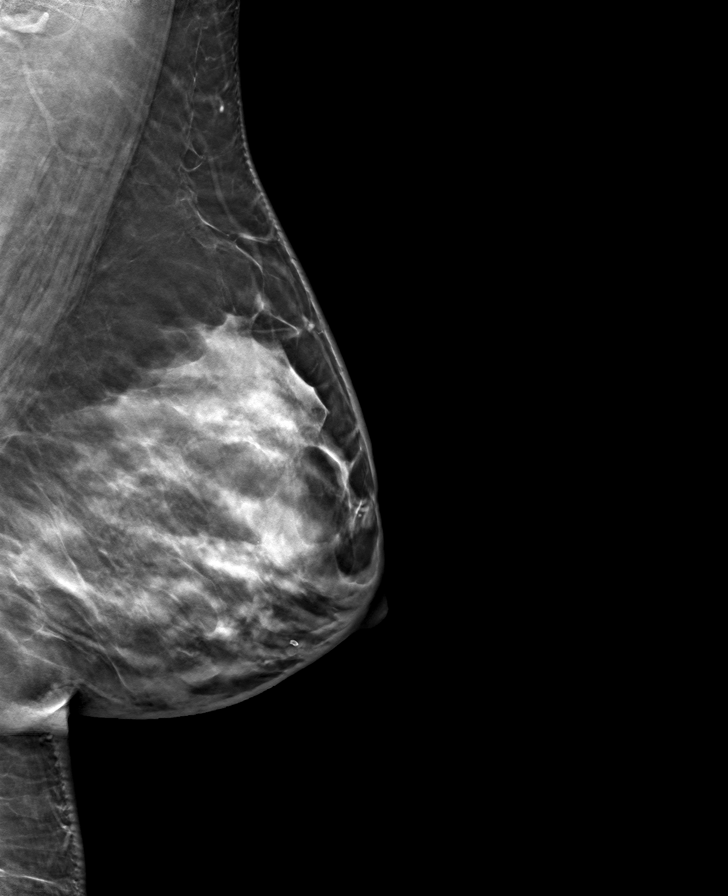

[8 of 24 positions shown; findings below may reference images not displayed]

ACR Breast Density Category c: The breast tissue is heterogeneously
dense, which may obscure small masses.
FINDINGS: Diagnostic images demonstrate stable mammographic appearance of an
oval circumscribed mass in the LEFT breast. No new suspicious
findings within the LEFT breast. Stable mammographic appearance of
benign RIGHT breast calcifications. No suspicious mass, distortion,
or microcalcifications are identified to suggest presence of
malignancy in the RIGHT breast.

Targeted ultrasound was performed of the LEFT breast. At 4 o'clock 4
cm from the nipple, there is revisualization of an oval
circumscribed hypoechoic mass. It measures 8 x 8 x 3 mm, previously
7 by 8 x 3 mm. Differences in measurement are due to differences in
scan plane technique.
IMPRESSION: 1. Stable probably benign LEFT breast mass. Recommend follow-up
mammogram and ultrasound in 1 year. This will establish greater than
2 years of stability.
2. No mammographic evidence of malignancy in the RIGHT breast.

RECOMMENDATION:
Bilateral diagnostic mammogram with LEFT breast ultrasound in 1
year.

I have discussed the findings and recommendations with the patient.
If applicable, a reminder letter will be sent to the patient
regarding the next appointment.

BI-RADS CATEGORY  3: Probably benign.

## 2021-08-20 IMAGING — US US BREAST*L* LIMITED INC AXILLA
1 series · 6 of 6 positions shown · non-contrast
Comparison: Previous exam(s).

CLINICAL DATA: BI-RADS 3 follow-up of a LEFT breast mass, initiated
April 2019. Patient has completed BI-RADS 3 follow-up of
calcifications and RIGHT breast masses.

EXAM:
DIGITAL DIAGNOSTIC BILATERAL MAMMOGRAM WITH TOMOSYNTHESIS AND CAD;
ULTRASOUND LEFT BREAST LIMITED
TECHNIQUE: Bilateral digital diagnostic mammography and breast tomosynthesis
was performed. The images were evaluated with computer-aided
detection.; Targeted ultrasound examination of the left breast was
performed

[Series 1: us breast*left* limited inc axilla · 0.05mm/px · 6 of 6 slices shown]
[im 1/6]
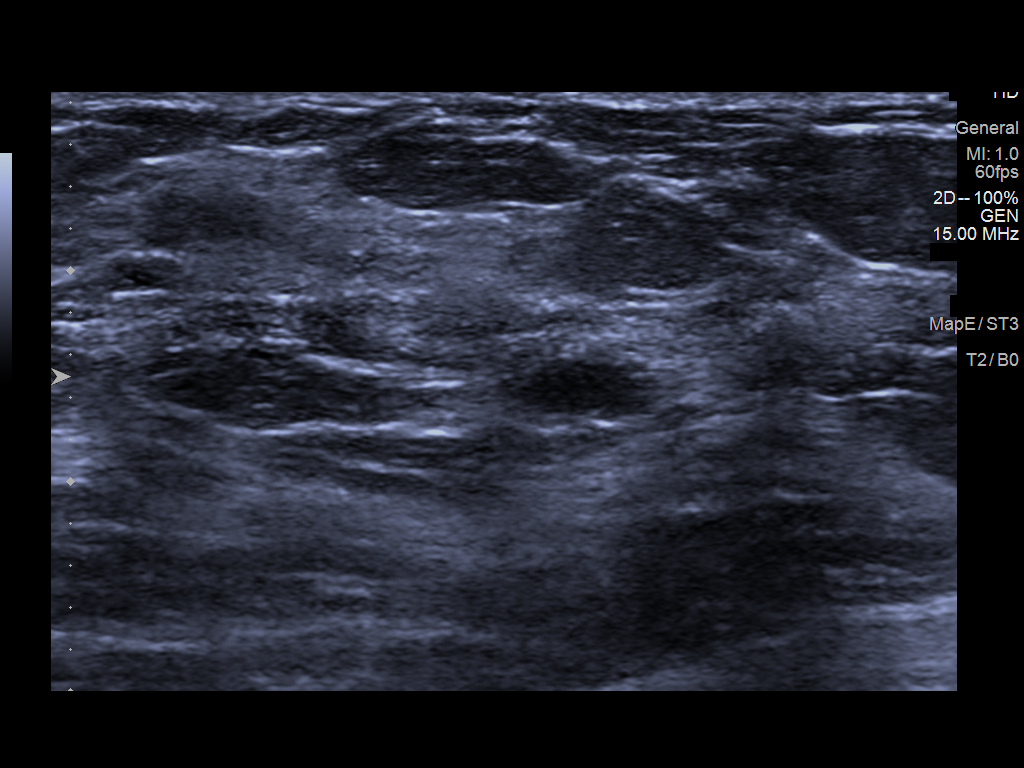
[im 2/6]
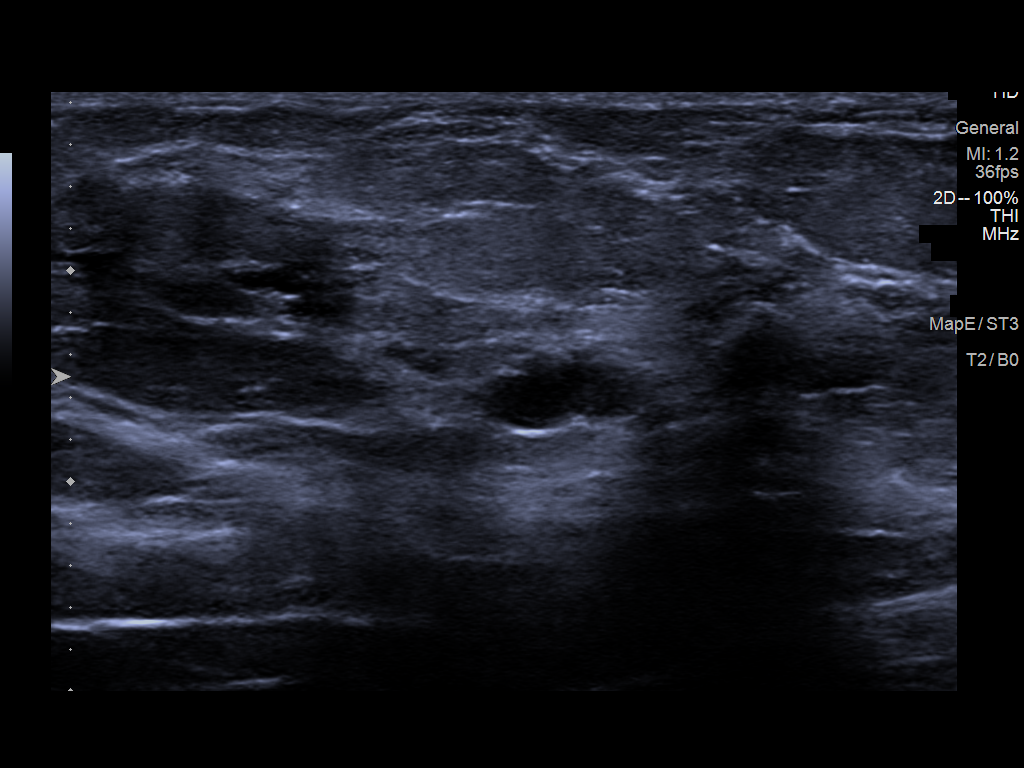
[im 3/6]
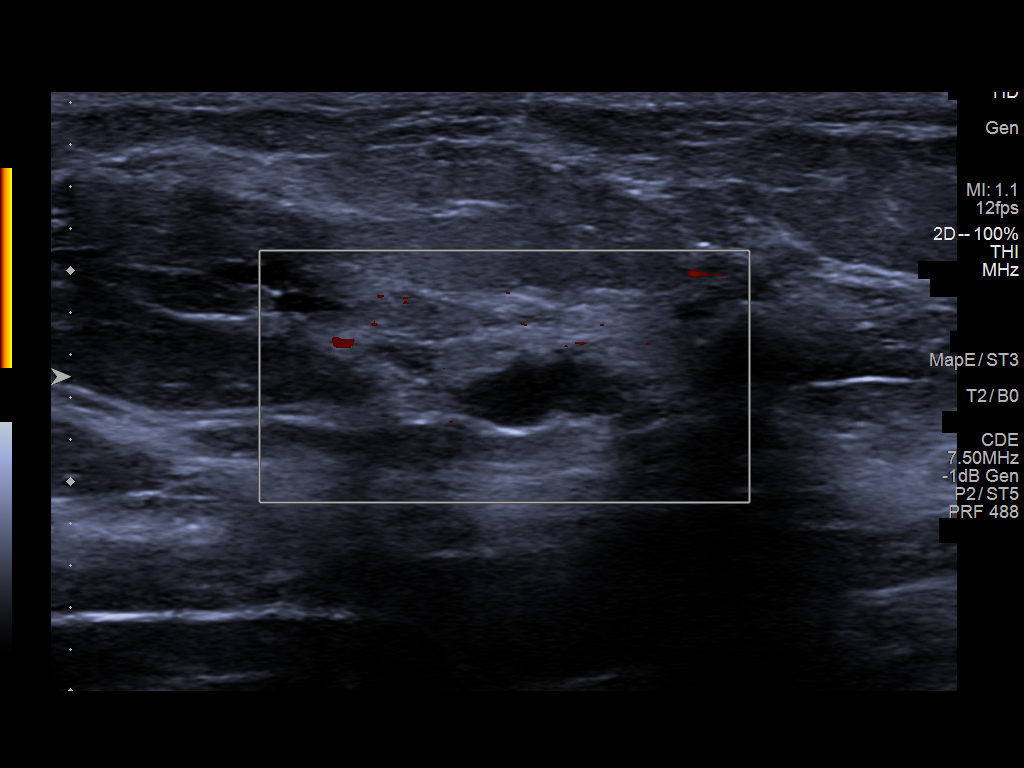
[im 4/6]
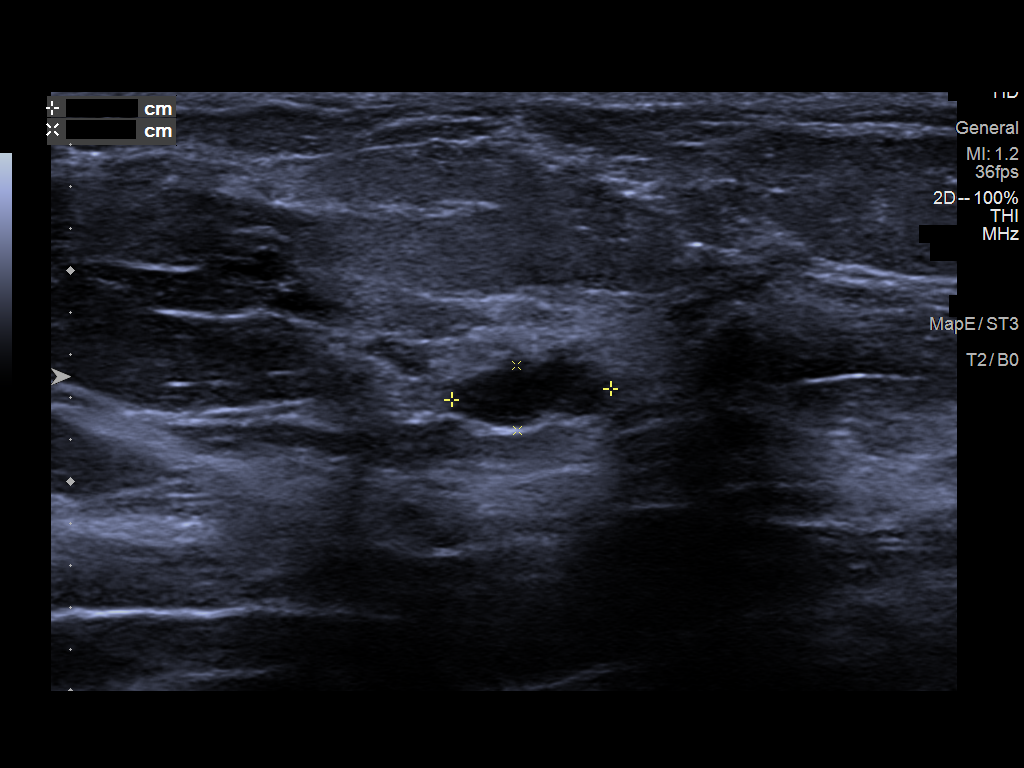
[im 5/6]
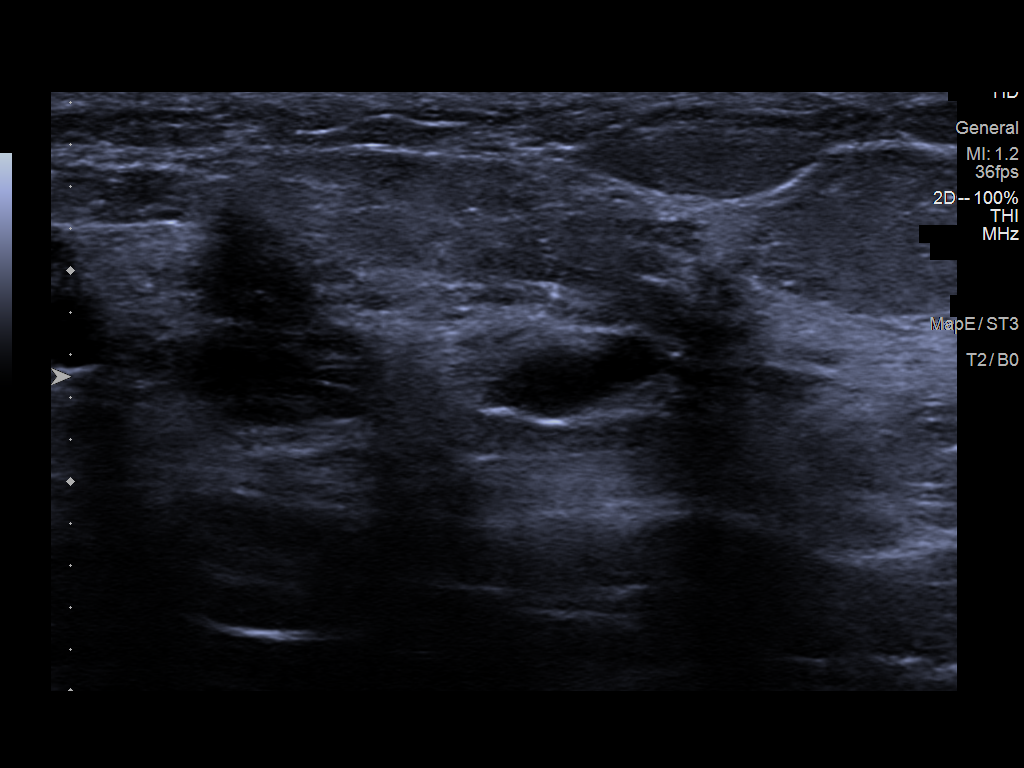
[im 6/6]
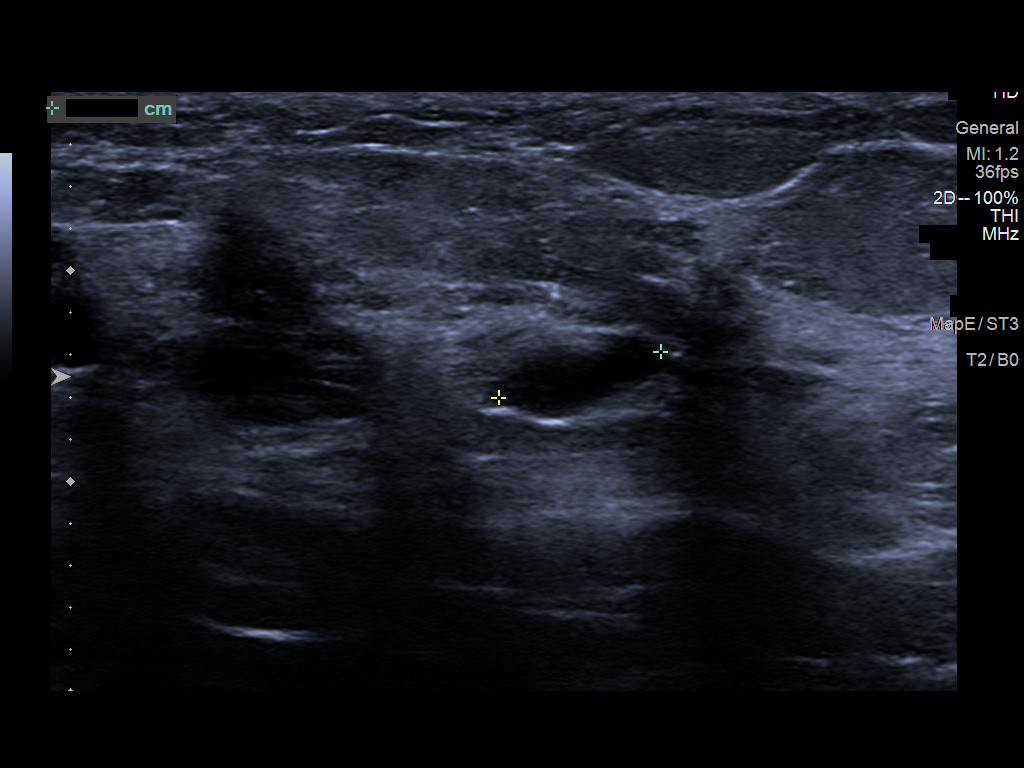

[6 of 6 positions shown; findings below may reference images not displayed]

ACR Breast Density Category c: The breast tissue is heterogeneously
dense, which may obscure small masses.
FINDINGS: Diagnostic images demonstrate stable mammographic appearance of an
oval circumscribed mass in the LEFT breast. No new suspicious
findings within the LEFT breast. Stable mammographic appearance of
benign RIGHT breast calcifications. No suspicious mass, distortion,
or microcalcifications are identified to suggest presence of
malignancy in the RIGHT breast.

Targeted ultrasound was performed of the LEFT breast. At 4 o'clock 4
cm from the nipple, there is revisualization of an oval
circumscribed hypoechoic mass. It measures 8 x 8 x 3 mm, previously
7 by 8 x 3 mm. Differences in measurement are due to differences in
scan plane technique.
IMPRESSION: 1. Stable probably benign LEFT breast mass. Recommend follow-up
mammogram and ultrasound in 1 year. This will establish greater than
2 years of stability.
2. No mammographic evidence of malignancy in the RIGHT breast.

RECOMMENDATION:
Bilateral diagnostic mammogram with LEFT breast ultrasound in 1
year.

I have discussed the findings and recommendations with the patient.
If applicable, a reminder letter will be sent to the patient
regarding the next appointment.

BI-RADS CATEGORY  3: Probably benign.

## 2021-11-06 ENCOUNTER — Other Ambulatory Visit: Payer: Self-pay

## 2021-11-10 ENCOUNTER — Encounter: Payer: Self-pay | Admitting: Internal Medicine

## 2021-11-10 ENCOUNTER — Telehealth (INDEPENDENT_AMBULATORY_CARE_PROVIDER_SITE_OTHER): Payer: 59 | Admitting: Internal Medicine

## 2021-11-10 DIAGNOSIS — J40 Bronchitis, not specified as acute or chronic: Secondary | ICD-10-CM | POA: Diagnosis not present

## 2021-11-10 DIAGNOSIS — B3731 Acute candidiasis of vulva and vagina: Secondary | ICD-10-CM | POA: Diagnosis not present

## 2021-11-10 DIAGNOSIS — R051 Acute cough: Secondary | ICD-10-CM | POA: Diagnosis not present

## 2021-11-10 MED ORDER — FLUCONAZOLE 150 MG PO TABS: 150.0000 mg | ORAL_TABLET | Freq: Once | ORAL | 0 refills | Status: AC

## 2021-11-10 MED ORDER — ALBUTEROL SULFATE HFA 108 (90 BASE) MCG/ACT IN AERS: 1.0000 | INHALATION_SPRAY | Freq: Four times a day (QID) | RESPIRATORY_TRACT | 2 refills | Status: DC | PRN

## 2021-11-10 MED ORDER — BENZONATATE 100 MG PO CAPS: 200.0000 mg | ORAL_CAPSULE | Freq: Three times a day (TID) | ORAL | 0 refills | Status: DC | PRN

## 2021-11-10 MED ORDER — BENZONATATE 200 MG PO CAPS: 200.0000 mg | ORAL_CAPSULE | Freq: Three times a day (TID) | ORAL | 0 refills | Status: DC | PRN

## 2021-11-10 MED ORDER — METHYLPREDNISOLONE 4 MG PO TBPK: ORAL_TABLET | ORAL | 0 refills | Status: DC

## 2021-11-10 MED ORDER — AZITHROMYCIN 250 MG PO TABS: ORAL_TABLET | ORAL | 0 refills | Status: AC

## 2021-11-10 MED ORDER — HYDROCOD POLI-CHLORPHE POLI ER 10-8 MG/5ML PO SUER: 5.0000 mL | Freq: Every evening | ORAL | 0 refills | Status: DC | PRN

## 2021-11-10 NOTE — Progress Notes (Signed)
Telephone Note  I connected with Kara Vaughn  on 11/10/21 at 11:00 AM EDT by a telephone and verified that I am speaking with the correct person using two identifiers.  Location patient: [place] Location provider:work or home office Persons participating in the virtual visit: patient, provider  I discussed the limitations and requested verbal permission for telemedicine visit. The patient expressed understanding and agreed to proceed.   HPI:  Acute telemedicine visit for : In [place] recently and back 10/30/21 with dry cough, no fever, no sore throat, nasal drainage, no PND Tried xyzal, flonase. Robitussin  Not tried covid testing No h/o asthma   -Pertinent past medical history: see below -Pertinent medication allergies:No Known Allergies -COVID-19 vaccine status:  Immunization History  Administered Date(s) Administered   Influenza,inj,Quad PF,6+ Mos 02/10/2020   Influenza-Unspecified 02/23/2012, 01/14/2014, 01/29/2015, 02/05/2019   PFIZER(Purple Top)SARS-COV-2 Vaccination 05/15/2019, 06/06/2019, 02/15/2020   Tdap 07/01/2017     ROS: See pertinent positives and negatives per HPI.  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   Migraine     Past Surgical History:  Procedure Laterality Date   BREAST BIOPSY Right 10/17/2017   pseudoangiomatous stromal hyperplasia    CYST REMOVAL TRUNK  07/2012   [facility]   VAGINAL DELIVERY     3     Current Outpatient Medications:    albuterol (VENTOLIN HFA) 108 (90 Base) MCG/ACT inhaler, Inhale 1-2 puffs into the lungs every 6 (six) hours as needed for wheezing or shortness of breath., Disp: 18 g, Rfl: 2   azithromycin (ZITHROMAX) 250 MG tablet, Take 2 tablets on day 1, then 1 tablet  daily on days 2 through 5 with food, Disp: 6 tablet, Rfl: 0   chlorpheniramine-HYDROcodone (TUSSIONEX PENNKINETIC ER) 10-8 MG/5ML, Take 5 mLs by mouth at bedtime as needed., Disp: 115 mL, Rfl: 0   Cholecalciferol 1.25 MG (50000 UT) capsule, 50,000 units PO qwk for 8 weeks., Disp: 8 capsule, Rfl: 0   COVID-19 At Home Antigen Test (CARESTART COVID-19 HOME TEST) KIT, use as directed, Disp: 2 kit, Rfl: 0   EPINEPHrine 0.3 mg/0.3 mL IJ SOAJ injection, Inject 0.3 mg into the muscle as needed for anaphylaxis., Disp: 2 each, Rfl: 1   fluconazole (DIFLUCAN) 150 MG tablet, Take 1 tablet (150 mg total) by mouth once for 1 dose., Disp: 1 tablet, Rfl: 0   fluticasone (FLONASE) 50 MCG/ACT nasal spray, Place 2 sprays into both nostrils daily., Disp: 16 g, Rfl: 6   levocetirizine (XYZAL) 5 MG tablet, Take 1 tablet (5 mg total) by mouth every evening., Disp: 30 tablet, Rfl: 2   levonorgestrel (MIRENA) 20 MCG/24HR IUD, 1 each by Intrauterine route once., Disp: , Rfl:    methylPREDNISolone (MEDROL DOSEPAK) 4 MG TBPK tablet, Use as directed in the am with food, Disp: 21 tablet, Rfl: 0   Multiple Vitamin (MULTIVITAMIN) tablet, Take 1 tablet by mouth daily., Disp: , Rfl:    SUMAtriptan (IMITREX) 100 MG tablet, TAKE 1 TABLET BY MOUTH ONCE FOR 1 DOSE. MAY REPEAT DOSE AFTER 2 HOURS IF HEADACHE PERSISTS OR RECURS., Disp: 9 tablet, Rfl: 0   benzonatate (TESSALON) 100 MG capsule, Take 2 capsules (200 mg total) by  mouth 3 (three) times daily as needed for cough., Disp: 30 capsule, Rfl: 0  EXAM:  VITALS per patient if applicable:  GENERAL: alert, oriented, appears well and in no acute distress  PSYCH/NEURO: pleasant and cooperative, no obvious depression or anxiety, speech and thought processing grossly intact  ASSESSMENT AND PLAN:  Discussed the following assessment and plan:  Bronchitis - Plan: albuterol (VENTOLIN HFA) 108 (90 Base) MCG/ACT inhaler, benzonatate (TESSALON) 200 MG capsule, azithromycin (ZITHROMAX) 250  MG tablet, Ambulatory referral to Allergy Dr. Orvil Feil Medrol dose pak   Acute cough - Plan: benzonatate (TESSALON) 200 MG capsule tid prn Prn tussionex   Yeast vaginitis - Plan: fluconazole (DIFLUCAN) 150 MG tablet  -we discussed possible serious and likely etiologies, options for evaluation and workup, limitations of telemedicine visit vs in person visit, treatment, treatment risks and precautions. Pt is agreeable to treatment via telemedicine at this moment.   I discussed the assessment and treatment plan with the patient. The patient was provided an opportunity to ask questions and all were answered. The patient agreed with the plan and demonstrated an understanding of the instructions.    Time spent 20 minutes Delorise Jackson, MD

## 2021-11-10 NOTE — Patient Instructions (Signed)
Dr. Orvil Feil do you need breathing tests to see if you have asthma? [facility] [address]  [address]  [place], [place]  [address]  [phone] (phone)  [phone] (fax)

## 2021-12-04 ENCOUNTER — Telehealth: Payer: Self-pay | Admitting: Family

## 2021-12-04 NOTE — Telephone Encounter (Signed)
Patient has a lab appt 12/08/2021, there are no orders in.

## 2021-12-07 ENCOUNTER — Other Ambulatory Visit: Payer: Self-pay | Admitting: Family

## 2021-12-07 DIAGNOSIS — E559 Vitamin D deficiency, unspecified: Secondary | ICD-10-CM

## 2021-12-07 NOTE — Telephone Encounter (Signed)
SECOND REQUEST FOR LABS. PT HAS LAB APPT TOMORROW (12/08/21)

## 2021-12-08 ENCOUNTER — Other Ambulatory Visit: Payer: Self-pay | Admitting: Family

## 2021-12-08 ENCOUNTER — Other Ambulatory Visit: Payer: Self-pay

## 2021-12-08 ENCOUNTER — Other Ambulatory Visit (INDEPENDENT_AMBULATORY_CARE_PROVIDER_SITE_OTHER): Payer: 59

## 2021-12-08 DIAGNOSIS — E559 Vitamin D deficiency, unspecified: Secondary | ICD-10-CM | POA: Diagnosis not present

## 2021-12-08 LAB — VITAMIN D 25 HYDROXY (VIT D DEFICIENCY, FRACTURES): VITD: 20.23 ng/mL — ABNORMAL LOW (ref 30.00–100.00)

## 2021-12-08 MED ORDER — CHOLECALCIFEROL 1.25 MG (50000 UT) PO CAPS
ORAL_CAPSULE | ORAL | 0 refills | Status: DC
  Filled 2021-12-08 – 2022-01-08 (×2): qty 8, 56d supply, fill #0

## 2021-12-08 NOTE — Telephone Encounter (Signed)
Patient ncame in and had vitamin D lab drawn this morning

## 2021-12-24 ENCOUNTER — Other Ambulatory Visit: Payer: Self-pay

## 2022-01-05 ENCOUNTER — Encounter: Payer: Self-pay | Admitting: Family

## 2022-01-08 ENCOUNTER — Other Ambulatory Visit: Payer: Self-pay

## 2022-01-12 ENCOUNTER — Ambulatory Visit (LOCAL_COMMUNITY_HEALTH_CENTER): Payer: Self-pay

## 2022-01-12 DIAGNOSIS — Z111 Encounter for screening for respiratory tuberculosis: Secondary | ICD-10-CM

## 2022-01-15 ENCOUNTER — Ambulatory Visit (LOCAL_COMMUNITY_HEALTH_CENTER): Payer: Self-pay

## 2022-01-15 DIAGNOSIS — Z111 Encounter for screening for respiratory tuberculosis: Secondary | ICD-10-CM

## 2022-01-15 LAB — TB SKIN TEST: TB Skin Test: NEGATIVE

## 2022-01-29 ENCOUNTER — Other Ambulatory Visit: Payer: Self-pay

## 2022-04-24 ENCOUNTER — Telehealth: Payer: 59 | Admitting: Physician Assistant

## 2022-04-24 DIAGNOSIS — B379 Candidiasis, unspecified: Secondary | ICD-10-CM | POA: Diagnosis not present

## 2022-04-24 DIAGNOSIS — T3695XA Adverse effect of unspecified systemic antibiotic, initial encounter: Secondary | ICD-10-CM | POA: Diagnosis not present

## 2022-04-24 MED ORDER — FLUCONAZOLE 150 MG PO TABS: 150.0000 mg | ORAL_TABLET | ORAL | 0 refills | Status: DC | PRN

## 2022-04-24 NOTE — Progress Notes (Signed)

## 2022-07-19 ENCOUNTER — Other Ambulatory Visit: Payer: Self-pay

## 2022-08-09 ENCOUNTER — Encounter: Payer: Commercial Managed Care - PPO | Admitting: Family

## 2022-09-13 ENCOUNTER — Telehealth: Payer: Self-pay | Admitting: Family

## 2022-09-13 NOTE — Telephone Encounter (Signed)
Spoke to pt and she will just get labs done on day she comes in

## 2022-09-13 NOTE — Telephone Encounter (Signed)
Pt called in staying that she needs some medical form to be fill by provider, pt made an appt for 5/22 @ 8:30am, however, pt was wondering if Arnett needs blood work before that?

## 2022-09-17 ENCOUNTER — Other Ambulatory Visit: Payer: Self-pay

## 2022-09-17 ENCOUNTER — Other Ambulatory Visit: Payer: Self-pay | Admitting: Family

## 2022-09-17 DIAGNOSIS — J302 Other seasonal allergic rhinitis: Secondary | ICD-10-CM

## 2022-09-17 MED ORDER — FLUTICASONE PROPIONATE 50 MCG/ACT NA SUSP
2.0000 | Freq: Every day | NASAL | 6 refills | Status: DC
  Filled 2022-09-17: qty 16, 30d supply, fill #0
  Filled 2023-07-20: qty 16, 30d supply, fill #1
  Filled 2023-08-02 – 2023-08-12 (×3): qty 16, 30d supply, fill #2

## 2022-09-17 MED ORDER — SUMATRIPTAN SUCCINATE 100 MG PO TABS
ORAL_TABLET | ORAL | 0 refills | Status: DC
  Filled 2022-09-17: qty 9, 9d supply, fill #0

## 2022-09-22 ENCOUNTER — Other Ambulatory Visit (HOSPITAL_COMMUNITY)
Admission: RE | Admit: 2022-09-22 | Discharge: 2022-09-22 | Disposition: A | Discharge status: Home / Self Care | Payer: BC Managed Care – PPO | Source: Ambulatory Visit | Attending: Family | Admitting: Family

## 2022-09-22 ENCOUNTER — Other Ambulatory Visit: Payer: Self-pay

## 2022-09-22 ENCOUNTER — Encounter: Payer: Self-pay | Admitting: Family

## 2022-09-22 ENCOUNTER — Ambulatory Visit: Payer: BC Managed Care – PPO | Admitting: Family

## 2022-09-22 VITALS — BP 126/78 | HR 71 | Temp 97.6°F | Ht 69.0 in | Wt 240.6 lb

## 2022-09-22 DIAGNOSIS — Z0000 Encounter for general adult medical examination without abnormal findings: Secondary | ICD-10-CM | POA: Diagnosis not present

## 2022-09-22 DIAGNOSIS — Z124 Encounter for screening for malignant neoplasm of cervix: Secondary | ICD-10-CM

## 2022-09-22 DIAGNOSIS — G43009 Migraine without aura, not intractable, without status migrainosus: Secondary | ICD-10-CM

## 2022-09-22 DIAGNOSIS — Z1159 Encounter for screening for other viral diseases: Secondary | ICD-10-CM | POA: Diagnosis not present

## 2022-09-22 DIAGNOSIS — Z1231 Encounter for screening mammogram for malignant neoplasm of breast: Secondary | ICD-10-CM

## 2022-09-22 DIAGNOSIS — T783XXA Angioneurotic edema, initial encounter: Secondary | ICD-10-CM | POA: Diagnosis not present

## 2022-09-22 LAB — CBC WITH DIFFERENTIAL/PLATELET
Basophils Absolute: 0.1 10*3/uL (ref 0.0–0.1)
Basophils Relative: 0.9 % (ref 0.0–3.0)
Eosinophils Absolute: 0.1 10*3/uL (ref 0.0–0.7)
Eosinophils Relative: 2.2 % (ref 0.0–5.0)
HCT: 43.5 % (ref 36.0–46.0)
Hemoglobin: 14.3 g/dL (ref 12.0–15.0)
Lymphocytes Relative: 19.9 % (ref 12.0–46.0)
Lymphs Abs: 1.3 10*3/uL (ref 0.7–4.0)
MCHC: 32.7 g/dL (ref 30.0–36.0)
MCV: 89.2 fl (ref 78.0–100.0)
Monocytes Absolute: 0.5 10*3/uL (ref 0.1–1.0)
Monocytes Relative: 7.8 % (ref 3.0–12.0)
Neutro Abs: 4.5 10*3/uL (ref 1.4–7.7)
Neutrophils Relative %: 69.2 % (ref 43.0–77.0)
Platelets: 274 10*3/uL (ref 150.0–400.0)
RBC: 4.88 Mil/uL (ref 3.87–5.11)
RDW: 13.8 % (ref 11.5–15.5)
WBC: 6.4 10*3/uL (ref 4.0–10.5)

## 2022-09-22 LAB — LIPID PANEL
Cholesterol: 140 mg/dL (ref 0–200)
HDL: 59.8 mg/dL (ref >39.00)
LDL Cholesterol: 69 mg/dL (ref 0–99)
NonHDL: 80.19
Total CHOL/HDL Ratio: 2
Triglycerides: 56 mg/dL (ref 0.0–149.0)
VLDL: 11.2 mg/dL (ref 0.0–40.0)

## 2022-09-22 LAB — COMPREHENSIVE METABOLIC PANEL
ALT: 12 U/L (ref 0–35)
AST: 17 U/L (ref 0–37)
Albumin: 4.5 g/dL (ref 3.5–5.2)
Alkaline Phosphatase: 48 U/L (ref 39–117)
BUN: 14 mg/dL (ref 6–23)
CO2: 27 mEq/L (ref 19–32)
Calcium: 8.9 mg/dL (ref 8.4–10.5)
Chloride: 101 mEq/L (ref 96–112)
Creatinine, Ser: 0.71 mg/dL (ref 0.40–1.20)
GFR: 104.64 mL/min (ref >60.00)
Glucose, Bld: 86 mg/dL (ref 70–99)
Potassium: 3.9 mEq/L (ref 3.5–5.1)
Sodium: 137 mEq/L (ref 135–145)
Total Bilirubin: 1 mg/dL (ref 0.2–1.2)
Total Protein: 7 g/dL (ref 6.0–8.3)

## 2022-09-22 LAB — VITAMIN D 25 HYDROXY (VIT D DEFICIENCY, FRACTURES): VITD: 24.37 ng/mL — ABNORMAL LOW (ref 30.00–100.00)

## 2022-09-22 LAB — TSH: TSH: 1.31 u[IU]/mL (ref 0.35–5.50)

## 2022-09-22 LAB — HEMOGLOBIN A1C: Hgb A1c MFr Bld: 5.6 % (ref 4.6–6.5)

## 2022-09-22 MED ORDER — EPINEPHRINE 0.3 MG/0.3ML IJ SOAJ
0.3000 mg | INTRAMUSCULAR | 1 refills | Status: DC | PRN
  Filled 2022-09-22: qty 2, 30d supply, fill #0
  Filled 2023-07-20: qty 2, 30d supply, fill #1

## 2022-09-22 NOTE — Assessment & Plan Note (Signed)
Rare HA. Excellent control of blood pressure. Continue imitrex 100mg   prn.

## 2022-09-22 NOTE — Assessment & Plan Note (Addendum)
Clinical breast exam performed today.  Patient will schedule diagnostic breast imaging.  Pap smear performed.  IUD in place.  Completed Paperwork.  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

## 2022-09-22 NOTE — Patient Instructions (Addendum)
Please call  and schedule your 3D mammogram and /or bone density scan as we discussed.   [facility]  ( new location in 2023)  [address], [place], [place] [address]  [place], [place]  [phone] Health Maintenance, Female Adopting a healthy lifestyle and getting preventive care are important in promoting health and wellness. Ask your health care provider about: The right schedule for you to have regular tests and exams. Things you can do on your own to prevent diseases and keep yourself healthy. What should I know about diet, weight, and exercise? Eat a healthy diet  Eat a diet that includes plenty of vegetables, fruits, low-fat dairy products, and lean protein. Do not eat a lot of foods that are high in solid fats, added sugars, or sodium. Maintain a healthy weight Body mass index (BMI) is used to identify weight problems. It estimates body fat based on height and weight. Your health care provider can help determine your BMI and help you achieve or maintain a healthy weight. Get regular exercise Get regular exercise. This is one of the most important things you can do for your health. Most adults should: Exercise for at least 150 minutes each week. The exercise should increase your heart rate and make you sweat (moderate-intensity exercise). Do strengthening exercises at least twice a week. This is in addition to the moderate-intensity exercise. Spend less time sitting. Even light physical activity can be beneficial. Watch cholesterol and blood lipids Have your blood tested for lipids and cholesterol at 43 years of age, then have this test every 5 years. Have your cholesterol levels checked more often if: Your lipid or cholesterol levels are high. You are older than 43 years of age. You are at high risk for heart disease. What should I know about cancer screening? Depending on your health history and family history, you may need to have cancer screening at  various ages. This may include screening for: Breast cancer. Cervical cancer. Colorectal cancer. Skin cancer. Lung cancer. What should I know about heart disease, diabetes, and high blood pressure? Blood pressure and heart disease High blood pressure causes heart disease and increases the risk of stroke. This is more likely to develop in people who have high blood pressure readings or are overweight. Have your blood pressure checked: Every 3-5 years if you are 18-39 years of age. Every year if you are 40 years old or older. Diabetes Have regular diabetes screenings. This checks your fasting blood sugar level. Have the screening done: Once every three years after age 40 if you are at a normal weight and have a low risk for diabetes. More often and at a younger age if you are overweight or have a high risk for diabetes. What should I know about preventing infection? Hepatitis B If you have a higher risk for hepatitis B, you should be screened for this virus. Talk with your health care provider to find out if you are at risk for hepatitis B infection. Hepatitis C Testing is recommended for: Everyone born from 1945 through 1965. Anyone with known risk factors for hepatitis C. Sexually transmitted infections (STIs) Get screened for STIs, including gonorrhea and chlamydia, if: You are sexually active and are younger than 43 years of age. You are older than 43 years of age and your health care provider tells you that you are at risk for this type of infection. Your sexual activity has changed since you were last screened, and you are at increased   risk for chlamydia or gonorrhea. Ask your health care provider if you are at risk. Ask your health care provider about whether you are at high risk for HIV. Your health care provider may recommend a prescription medicine to help prevent HIV infection. If you choose to take medicine to prevent HIV, you should first get tested for HIV. You should then be  tested every 3 months for as long as you are taking the medicine. Pregnancy If you are about to stop having your period (premenopausal) and you may become pregnant, seek counseling before you get pregnant. Take 400 to 800 micrograms (mcg) of folic acid every day if you become pregnant. Ask for birth control (contraception) if you want to prevent pregnancy. Osteoporosis and menopause Osteoporosis is a disease in which the bones lose minerals and strength with aging. This can result in bone fractures. If you are 65 years old or older, or if you are at risk for osteoporosis and fractures, ask your health care provider if you should: Be screened for bone loss. Take a calcium or vitamin D supplement to lower your risk of fractures. Be given hormone replacement therapy (HRT) to treat symptoms of menopause. Follow these instructions at home: Alcohol use Do not drink alcohol if: Your health care provider tells you not to drink. You are pregnant, may be pregnant, or are planning to become pregnant. If you drink alcohol: Limit how much you have to: 0-1 drink a day. Know how much alcohol is in your drink. In the [place] one drink equals one 12 oz bottle of beer (355 mL), one 5 oz glass of wine (148 mL), or one 1 oz glass of hard liquor (44 mL). Lifestyle Do not use any products that contain nicotine or tobacco. These products include cigarettes, chewing tobacco, and vaping devices, such as e-cigarettes. If you need help quitting, ask your health care provider. Do not use street drugs. Do not share needles. Ask your health care provider for help if you need support or information about quitting drugs. General instructions Schedule regular health, dental, and eye exams. Stay current with your vaccines. Tell your health care provider if: You often feel depressed. You have ever been abused or do not feel safe at home. Summary Adopting a healthy lifestyle and getting preventive care are important in  promoting health and wellness. Follow your health care provider's instructions about healthy diet, exercising, and getting tested or screened for diseases. Follow your health care provider's instructions on monitoring your cholesterol and blood pressure. This information is not intended to replace advice given to you by your health care provider. Make sure you discuss any questions you have with your health care provider. Document Revised: 09/08/2020 Document Reviewed: 09/08/2020 Elsevier Patient Education  2023 Elsevier Inc.  

## 2022-09-22 NOTE — Progress Notes (Signed)
Assessment & Plan:  Routine general medical examination at a health care facility Assessment & Plan: Clinical breast exam performed today.  Patient will schedule diagnostic breast imaging.  Pap smear performed.  IUD in place.  Completed Paperwork.  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  Orders: -     MM 3D DIAGNOSTIC MAMMOGRAM BILATERAL BREAST; Future -     [place] LIMITED ULTRASOUND INCLUDING AXILLA LEFT BREAST ; Future -     TSH -     CBC with Differential/Platelet -     Comprehensive metabolic panel -     Hemoglobin A1c -     VITAMIN D 25 Hydroxy (Vit-D Deficiency, Fractures) -     Lipid panel  Encounter for screening mammogram for malignant neoplasm of breast -     MM 3D DIAGNOSTIC MAMMOGRAM BILATERAL BREAST; Future -     [place] LIMITED ULTRASOUND INCLUDING AXILLA LEFT BREAST ; Future  Encounter for hepatitis C screening test for low risk patient -     Hepatitis C antibody  Angioedema of lips, initial encounter -     EPINEPHrine; Inject 0.3 mg into the muscle as needed for anaphylaxis.  Dispense: 2 each; Refill: 1  Migraine without aura and without status migrainosus, not intractable Assessment & Plan: Rare HA. Excellent control of blood pressure. Continue imitrex 100mg   prn.    Screening for cervical cancer -     Cytology - PAP     Return precautions given.   Risks, benefits, and alternatives of the medications and treatment plan prescribed today were discussed, and patient expressed understanding.   Education regarding symptom management and diagnosis given to patient on AVS either electronically or printed.  No follow-ups on file.  Rennie Plowman, FNP  Subjective:    Patient ID: Kara Vaughn, female    DOB: [birth year], 42 y.o.   MRN: [id number]  CC: Kara Vaughn is a 42 y.o. female who presents today for physical exam.    HPI: Feels well today No complaints  She has not had a HA in a long time or required Imitrex.  HA feels similar to prior HA.   History of angioedema ; she carries an epinephrine pen.  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 She has the Mirena.          Tetanus - UTD        Hepatitis C screening - Candidate for,consents  Exercise: Gets regular exercise.   Alcohol use:  rare Smoking/tobacco use: Nonsmoker.    Health Maintenance  Topic Date Due   Hepatitis C Screening: USPSTF Recommendation to screen - Ages 41-79 yo.  Never done   COVID-19 Vaccine (4 - 2023-24 season) 01/01/2022   Flu Shot  12/02/2022   Pap Smear  07/29/2024   DTaP/Tdap/Td vaccine (2 - Td or Tdap) 07/02/2027   HIV Screening  Completed   HPV Vaccine  Aged Out    ALLERGIES: Patient has no known allergies.  Current Outpatient Medications on File Prior to Visit  Medication Sig Dispense Refill   albuterol (VENTOLIN HFA) 108 (90 Base) MCG/ACT inhaler Inhale 1-2 puffs into the lungs every 6 (six) hours as needed for wheezing or shortness of breath. 18 g 2   Cholecalciferol 1.25 MG (50000 UT) capsule Take one capsule by mouth every week for  8 weeks 8 capsule 0   COVID-19 At Home Antigen Test (CARESTART COVID-19 HOME TEST) KIT use as directed 2 kit 0   fluconazole (DIFLUCAN) 150 MG tablet Take 1 tablet (150 mg total) by mouth every 3 (three) days as needed. 2 tablet 0   fluticasone (FLONASE) 50 MCG/ACT nasal spray Place 2 sprays into both nostrils daily. 16 g 6   levocetirizine (XYZAL) 5 MG tablet Take 1 tablet (5 mg total) by mouth every evening. 30 tablet 2   levonorgestrel (MIRENA) 20 MCG/24HR IUD 1 each by Intrauterine route once.     methylPREDNISolone (MEDROL DOSEPAK) 4 MG TBPK tablet Use as directed in the am with food 21 tablet 0   Multiple Vitamin (MULTIVITAMIN) tablet Take 1 tablet by mouth daily.     SUMAtriptan (IMITREX) 100 MG  tablet TAKE 1 TABLET BY MOUTH ONCE FOR 1 DOSE. MAY REPEAT DOSE AFTER 2 HOURS IF HEADACHE PERSISTS OR RECURS. 9 tablet 0   No current facility-administered medications on file prior to visit.    Review of Systems  Constitutional:  Negative for chills, fever and unexpected weight change.  HENT:  Negative for congestion.   Respiratory:  Negative for cough.   Cardiovascular:  Negative for chest pain, palpitations and leg swelling.  Gastrointestinal:  Negative for nausea and vomiting.  Musculoskeletal:  Negative for arthralgias and myalgias.  Skin:  Negative for rash.  Neurological:  Negative for headaches.  Hematological:  Negative for adenopathy.  Psychiatric/Behavioral:  Negative for confusion.       Objective:    BP 126/78   Pulse 71   Temp 97.6 F (36.4 C) (Oral)   Ht 5\' 9"  (1.753 m)   Wt 240 lb 9.6 oz (109.1 kg)   SpO2 99%   BMI 35.53 kg/m   BP Readings from Last 3 Encounters:  09/22/22 126/78  07/29/21 128/80  07/28/20 102/60   Wt Readings from Last 3 Encounters:  09/22/22 240 lb 9.6 oz (109.1 kg)  07/29/21 252 lb 1.6 oz (114.4 kg)  07/28/20 246 lb 6.4 oz (111.8 kg)    Physical Exam Vitals reviewed.  Constitutional:      Appearance: Normal appearance. She is well-developed.  Eyes:     Conjunctiva/sclera: Conjunctivae normal.  Neck:     Thyroid: No thyroid mass or thyromegaly.  Cardiovascular:     Rate and Rhythm: Normal rate and regular rhythm.     Pulses: Normal pulses.     Heart sounds: Normal heart sounds.  Pulmonary:     Effort: Pulmonary effort is normal.     Breath sounds: Normal breath sounds. No wheezing, rhonchi or rales.  Chest:  Breasts:    Breasts are symmetrical.     Right: No inverted nipple, mass, nipple discharge, skin change or tenderness.     Left: No inverted nipple, mass, nipple discharge, skin change or tenderness.  Abdominal:     General: Bowel sounds are normal. There is no distension.     Palpations: Abdomen is soft. Abdomen is  not rigid. There is no fluid wave or mass.     Tenderness: There is no abdominal tenderness. There is no guarding or rebound.  Genitourinary:    Cervix: No cervical motion tenderness, discharge or friability.     Uterus: Not enlarged, not fixed and not tender.      Adnexa:        Right: No mass, tenderness or fullness.         Left: No mass, tenderness or  fullness.       Comments: Pap performed. No CMT. Unable to appreciated ovaries.  2 IUD strings seen coming from cervical os Lymphadenopathy:     Head:     Right side of head: No submental, submandibular, tonsillar, preauricular, posterior auricular or occipital adenopathy.     Left side of head: No submental, submandibular, tonsillar, preauricular, posterior auricular or occipital adenopathy.     Cervical:     Right cervical: No superficial, deep or posterior cervical adenopathy.    Left cervical: No superficial, deep or posterior cervical adenopathy.     Upper Body:     Right upper body: No pectoral adenopathy.     Left upper body: No pectoral adenopathy.  Skin:    General: Skin is warm and dry.  Neurological:     Mental Status: She is alert.  Psychiatric:        Speech: Speech normal.        Behavior: Behavior normal.        Thought Content: Thought content normal.

## 2022-09-23 LAB — HEPATITIS C ANTIBODY: Hepatitis C Ab: NONREACTIVE

## 2022-09-24 ENCOUNTER — Other Ambulatory Visit: Payer: Self-pay | Admitting: Family

## 2022-09-24 ENCOUNTER — Other Ambulatory Visit: Payer: Self-pay

## 2022-09-24 DIAGNOSIS — Z8639 Personal history of other endocrine, nutritional and metabolic disease: Secondary | ICD-10-CM

## 2022-09-24 MED ORDER — CHOLECALCIFEROL 1.25 MG (50000 UT) PO CAPS
50000.0000 [IU] | ORAL_CAPSULE | ORAL | 0 refills | Status: DC
Start: 2022-09-24 — End: 2023-10-11
  Filled 2022-09-24: qty 8, 56d supply, fill #0

## 2022-09-28 LAB — CYTOLOGY - PAP
Comment: NEGATIVE
Diagnosis: NEGATIVE
High risk HPV: NEGATIVE

## 2022-10-08 ENCOUNTER — Other Ambulatory Visit: Payer: Commercial Managed Care - PPO

## 2023-05-26 ENCOUNTER — Ambulatory Visit: Payer: 59 | Admitting: Nurse Practitioner

## 2023-05-26 ENCOUNTER — Other Ambulatory Visit (HOSPITAL_COMMUNITY)
Admission: RE | Admit: 2023-05-26 | Discharge: 2023-05-26 | Disposition: A | Discharge status: Home / Self Care | Payer: 59 | Source: Ambulatory Visit | Attending: Nurse Practitioner | Admitting: Nurse Practitioner

## 2023-05-26 ENCOUNTER — Encounter: Payer: Self-pay | Admitting: Nurse Practitioner

## 2023-05-26 VITALS — BP 116/76 | HR 76 | Temp 97.7°F | Ht 69.0 in | Wt 234.2 lb

## 2023-05-26 DIAGNOSIS — N898 Other specified noninflammatory disorders of vagina: Secondary | ICD-10-CM | POA: Diagnosis present

## 2023-05-26 DIAGNOSIS — Z113 Encounter for screening for infections with a predominantly sexual mode of transmission: Secondary | ICD-10-CM

## 2023-05-26 NOTE — Assessment & Plan Note (Signed)
New onset of fishy odor for 2 weeks, no discharge, no dysuria. Possible bacterial vaginosis (BV) due to imbalance of vaginal flora. Recent new sexual partner. -Perform vaginal swab to test for BV, trichomonas, and STIs. -Swab result pending.

## 2023-05-26 NOTE — Patient Instructions (Signed)
VISIT SUMMARY:  You came in today because of a persistent vaginal odor that has been present for about two weeks. You mentioned that this started around the time you began a new sexual relationship. You have tried some home remedies, but they have not been very effective. You do not have any unusual discharge or pain when urinating.  YOUR PLAN:  -VAGINAL ODOR: The persistent fishy odor you are experiencing may be due to bacterial vaginosis (BV), which is an imbalance of the natural bacteria in the vagina. We will perform a vaginal swab to test for BV, trichomonas, and sexually transmitted infections (STIs). Once we have the lab results, we will discuss the appropriate treatment options.  INSTRUCTIONS:  We will contact you with the results of your vaginal swab test and discuss the next steps for treatment based on those results.

## 2023-05-26 NOTE — Progress Notes (Signed)
Established Patient Office Visit  Subjective:  Patient ID: Kara Vaughn, female    DOB: 12-07-1980  Age: 43 y.o. MRN: [id number]  CC:  Chief Complaint  Patient presents with   Acute Visit    Vaginal odor   Discussed the use of a AI scribe software for clinical note transcription with the patient, who gave verbal consent to proceed.   HPI  Kara Vaughn presents for with a chief complaint of a persistent vaginal odor, described as 'fishy,' which has been ongoing for approximately two weeks. They deny any associated discharge. The onset of the odor coincided with the initiation of a new sexual relationship, with the most recent intercourse occurring the night prior to the consultation. The patient has attempted home remedies, including vitamin C, yogurt, and boric acid tablets, with limited success. They deny any urinary frequency, dysuria or discharge. The patient sought medical consultation for further evaluation and potential testing for sexually transmitted infections.   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   Migraine     Past Surgical History:  Procedure Laterality Date   BREAST BIOPSY Right 10/17/2017   pseudoangiomatous stromal hyperplasia    CYST REMOVAL TRUNK  07/2012   [facility]   VAGINAL DELIVERY     3    Family History  Problem Relation Age of Onset   Thyroid disease Father    Heart disease Father        2011, MI   Colon polyps Father    Thyroid disease Sister    Cancer Maternal Aunt 37       colon cancer   Cancer Paternal Aunt        esophageal   COPD Maternal Grandfather    Cancer Paternal Grandmother 25       ?uterine cancer, breast cancer, bone cancer   Breast cancer Paternal Grandmother 26    Sudden Cardiac Death Paternal Grandfather 71   BRCA 1/2 Cousin        BRCA2+, half paternal cousin   Other Cousin        Lynch syndrome Aeronautical engineer)    Social History   Socioeconomic History   Marital status: Significant Other    Spouse name: Not on file   Number of children: Not on file   Years of education: Not on file   Highest education level: Not on file  Occupational History   Not on file  Tobacco Use   Smoking status: Never   Smokeless tobacco: Never  Substance and Sexual Activity   Alcohol use: Yes    Comment: Rarely   Drug use: No   Sexual activity: Not on file  Other Topics Concern   Not on file  Social History Narrative   Lives in [place], [place]. 3 children 72, 6, and 39 years old. Fluent in [place].      Teaches full time at 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 18 g 2   Cholecalciferol 1.25 MG (50000 UT) capsule Take 1 capsule (50,000 Units total) by mouth once a week for 8 weeks. 8 capsule 0   COVID-19 At Home Antigen Test (CARESTART COVID-19 HOME TEST) KIT use as directed 2 kit 0   EPINEPHrine 0.3 mg/0.3 mL IJ SOAJ injection Inject 0.3 mg into the muscle as needed for anaphylaxis. 2 each 1   fluticasone (FLONASE) 50 MCG/ACT nasal spray Place 2 sprays into both nostrils daily. 16 g 6   levocetirizine (XYZAL) 5 MG tablet Take 1 tablet (5 mg total) by mouth every evening. 30 tablet 2   levonorgestrel (MIRENA) 20 MCG/24HR IUD 1 each by  Intrauterine route once.     Multiple Vitamin (MULTIVITAMIN) tablet Take 1 tablet by mouth daily.     SUMAtriptan (IMITREX) 100 MG tablet TAKE 1 TABLET BY MOUTH ONCE FOR 1 DOSE. MAY REPEAT DOSE AFTER 2 HOURS IF HEADACHE PERSISTS OR RECURS. 9 tablet 0   methylPREDNISolone (MEDROL DOSEPAK) 4 MG TBPK tablet Use as directed in the am with food 21 tablet 0   fluconazole (DIFLUCAN) 150 MG tablet Take 1 tablet (150 mg total) by mouth every 3 (three) days as needed. (Patient not taking: Reported on 05/26/2023) 2 tablet 0   No facility-administered medications prior to visit.    No Known Allergies  ROS Review of Systems Negative unless indicated in HPI.    Objective:    Physical Exam  BP 116/76   Pulse 76   Temp 97.7 F (36.5 C)   Ht 5\' 9"  (1.753 m)   Wt 234 lb 3.2 oz (106.2 kg)   SpO2 98%   BMI 34.59 kg/m  Wt Readings from Last 3 Encounters:  05/26/23 234 lb 3.2 oz (106.2 kg)  09/22/22 240 lb 9.6 oz (109.1 kg)  07/29/21 252 lb 1.6 oz (114.4 kg)     Health Maintenance  Topic Date Due   COVID-19 Vaccine (4 - 2024-25 season) 06/11/2023 (Originally 01/02/2023)   INFLUENZA VACCINE  08/01/2023 (Originally 12/02/2022)   DTaP/Tdap/Td (2 - Td or Tdap) 07/02/2027   Cervical Cancer Screening (HPV/Pap Cotest)  09/22/2027   Hepatitis C Screening  Completed   HIV Screening  Completed   HPV VACCINES  Aged Out    There are no preventive care reminders to display for this patient.  Lab Results  Component Value Date   TSH 1.31 09/22/2022   Lab Results  Component Value Date   WBC 6.4 09/22/2022   HGB 14.3 09/22/2022   HCT 43.5 09/22/2022   MCV 89.2 09/22/2022   PLT 274.0 09/22/2022   Lab Results  Component Value Date   NA 137 09/22/2022   K 3.9 09/22/2022   CO2 27 09/22/2022   GLUCOSE 86 09/22/2022   BUN 14 09/22/2022   CREATININE 0.71 09/22/2022   BILITOT 1.0 09/22/2022   ALKPHOS 48 09/22/2022   AST 17 09/22/2022   ALT 12 09/22/2022   PROT 7.0 09/22/2022   ALBUMIN 4.5  09/22/2022   CALCIUM 8.9 09/22/2022   GFR 104.64 09/22/2022   Lab Results  Component Value Date   CHOL 140 09/22/2022  Lab Results  Component Value Date   HDL 59.80 09/22/2022   Lab Results  Component Value Date   LDLCALC 69 09/22/2022   Lab Results  Component Value Date   TRIG 56.0 09/22/2022   Lab Results  Component Value Date   CHOLHDL 2 09/22/2022   Lab Results  Component Value Date   HGBA1C 5.6 09/22/2022      Assessment & Plan:  Vaginal odor Assessment & Plan: New onset of fishy odor for 2 weeks, no discharge, no dysuria. Possible bacterial vaginosis (BV) due to imbalance of vaginal flora. Recent new sexual partner. -Perform vaginal swab to test for BV, trichomonas, and STIs. -Swab result pending.  Orders: -     Cervicovaginal ancillary only  Screening examination for STI -     HIV Antibody (routine testing w rflx)    Follow-up: No follow-ups on file.   Kara Dies, NP

## 2023-05-27 ENCOUNTER — Encounter: Payer: Self-pay | Admitting: Nurse Practitioner

## 2023-05-27 ENCOUNTER — Other Ambulatory Visit: Payer: Self-pay

## 2023-05-27 ENCOUNTER — Other Ambulatory Visit: Payer: Self-pay | Admitting: Nurse Practitioner

## 2023-05-27 DIAGNOSIS — B9689 Other specified bacterial agents as the cause of diseases classified elsewhere: Secondary | ICD-10-CM

## 2023-05-27 LAB — CERVICOVAGINAL ANCILLARY ONLY
Bacterial Vaginitis (gardnerella): POSITIVE — AB
Candida Glabrata: NEGATIVE
Candida Vaginitis: NEGATIVE
Chlamydia: NEGATIVE
Comment: NEGATIVE
Comment: NEGATIVE
Comment: NEGATIVE
Comment: NEGATIVE
Comment: NEGATIVE
Comment: NORMAL
Neisseria Gonorrhea: NEGATIVE
Trichomonas: NEGATIVE

## 2023-05-27 LAB — HIV ANTIBODY (ROUTINE TESTING W REFLEX): HIV 1&2 Ab, 4th Generation: NONREACTIVE

## 2023-05-27 MED ORDER — METRONIDAZOLE 500 MG PO TABS
500.0000 mg | ORAL_TABLET | Freq: Three times a day (TID) | ORAL | 0 refills | Status: AC
Start: 2023-05-27 — End: 2023-06-03
  Filled 2023-05-27: qty 21, 7d supply, fill #0

## 2023-05-27 MED ORDER — FLUCONAZOLE 150 MG PO TABS
150.0000 mg | ORAL_TABLET | Freq: Every day | ORAL | 1 refills | Status: AC
  Filled 2023-05-27: qty 1, 1d supply, fill #0
  Filled 2023-07-20: qty 1, 1d supply, fill #1

## 2023-06-12 ENCOUNTER — Telehealth: Payer: Commercial Managed Care - PPO | Admitting: Family

## 2023-06-12 ENCOUNTER — Telehealth: Payer: Commercial Managed Care - PPO | Admitting: Family Medicine

## 2023-06-12 DIAGNOSIS — B029 Zoster without complications: Secondary | ICD-10-CM

## 2023-06-12 DIAGNOSIS — R21 Rash and other nonspecific skin eruption: Secondary | ICD-10-CM

## 2023-06-12 MED ORDER — VALACYCLOVIR HCL 1 G PO TABS
1000.0000 mg | ORAL_TABLET | Freq: Three times a day (TID) | ORAL | 0 refills | Status: AC
Start: 2023-06-12 — End: 2023-06-22

## 2023-06-12 NOTE — Patient Instructions (Signed)
 Shingles  Shingles, or herpes zoster, is an infection. It gives you a skin rash and blisters. These infected areas may hurt a lot. Shingles only happens if: You've had chickenpox. You've been given a shot called a vaccine to protect you from getting chickenpox. Shingles is rare in this case. What are the causes? Shingles is caused by a germ called the varicella-zoster virus. This is the same germ that causes chickenpox. After you're exposed to the germ, it stays in your body but is dormant. This means it isn't active. Shingles happens if the germ becomes active again. This can happen years after you're first exposed to the germ. What increases the risk? You may be more likely to get shingles if: You're older than 44 years of age. You're under a lot of stress. You have a weak immune system. The immune system is your body's defense system. It may be weak if: You have human immunodeficiency virus (HIV). You have acquired immunodeficiency syndrome (AIDS). You have cancer. You take medicines that weaken your immune system. These include organ transplant medicines. What are the signs or symptoms? The first symptoms of shingles may be itching, tingling, or pain. Your skin may feel like it's burning. A few days or weeks later, you'll get a rash. Here's what you can expect: The rash is likely to be on one side of your body. The rash may be shaped like a belt or a band. Over time, it will turn into blisters filled with fluid. The blisters will break open and change into scabs. The scabs will dry up in about 2-3 weeks. You may also have: A fever. Chills. A headache. Nausea. How is this diagnosed? Shingles is diagnosed with a skin exam. A sample called a culture may be taken from one of your blisters and sent to a lab. This will show if you have shingles. How is this treated? The rash may last for several weeks. There's no cure for shingles, but your health care provider may give you medicines.  These medicines may: Help with pain. Help with itching. Help with irritation and swelling. Help you get better sooner. Help to prevent long-term problems. If the rash is on your face, you may need to see an eye doctor or an ear, nose, and throat (ENT) doctor. Follow these instructions at home: Medicines Take your medicines only as told by your provider. Put an anti-itch cream or numbing cream on the rash or blisters as told by your provider. Relieving itching and discomfort  To help with itching: Put cold, wet cloths called cold compresses on the rash or blisters. Take a cool bath. Try adding baking soda or dry oatmeal to the water. Do not bathe in hot water. Use calamine lotion on the rash or blisters. You can get this type of lotion at the store. Blister and rash care Keep your rash covered with a loose bandage. Wear loose clothes that don't rub on your rash. Take care of your rash as told by your provider. Make sure you: Wash your hands with soap and water for at least 20 seconds before and after you change your bandage. If you can't use soap and water, use hand sanitizer. Keep your rash and blisters clean by washing them with mild soap and cool water. Change your bandage. Check your rash every day for signs of infection. Check for: More redness, swelling, or pain. Fluid or blood. Warmth. Pus or a bad smell. Do not scratch your rash. Do not pick at your  blisters. To help you not scratch: Keep your fingernails clean and cut short. Try to wear gloves or mittens when you sleep. General instructions Rest. Wash your hands often with soap and water for at least 20 seconds. If you can't use soap and water, use hand sanitizer. Washing your hands lowers your chance of getting a skin infection. Your infection can cause chickenpox in others. If you have blisters that aren't scabs yet, stay away from: Babies. Pregnant people. Children who have eczema. Older people who have organ  transplants. People who have a long-term, or chronic, illness. Anyone who hasn't had chickenpox before. Anyone who hasn't gotten the chickenpox vaccine. How is this prevented? Vaccines are the best way to prevent you from getting chickenpox or shingles. Talk with your provider about getting these shots. Where to find more information Centers for Disease Control and Prevention (CDC): TonerPromos.no Contact a health care provider if: Your pain doesn't get better with medicine. Your pain doesn't get better after the rash heals. You have any signs of infection around the rash. Your rash or blisters get worse. You have a fever or chills. Get help right away if: The rash is on your face or nose. You have pain in your face or by your eye. You lose feeling on one side of your face. You have trouble seeing. You have ear pain or ringing in your ear. This information is not intended to replace advice given to you by your health care provider. Make sure you discuss any questions you have with your health care provider. Document Revised: 01/20/2023 Document Reviewed: 06/04/2022 Elsevier Patient Education  2024 [employer].

## 2023-06-12 NOTE — Progress Notes (Signed)
 Virtual Visit Consent   Kara Vaughn, you are scheduled for a virtual visit with a [facility] provider today. Just as with appointments in the office, your consent must be obtained to participate. Your consent will be active for this visit and any virtual visit you may have with one of our providers in the next 365 days. If you have a MyChart account, a copy of this consent can be sent to you electronically.  As this is a virtual visit, video technology does not allow for your provider to perform a traditional examination. This may limit your provider's ability to fully assess your condition. If your provider identifies any concerns that need to be evaluated in person or the need to arrange testing (such as labs, EKG, etc.), we will make arrangements to do so. Although advances in technology are sophisticated, we cannot ensure that it will always work on either your end or our end. If the connection with a video visit is poor, the visit may have to be switched to a telephone visit. With either a video or telephone visit, we are not always able to ensure that we have a secure connection.  By engaging in this virtual visit, you consent to the provision of healthcare and authorize for your insurance to be billed (if applicable) for the services provided during this visit. Depending on your insurance coverage, you may receive a charge related to this service.  I need to obtain your verbal consent now. Are you willing to proceed with your visit today? Caroleena Paolini has provided verbal consent on 06/12/2023 for a virtual visit (video or telephone). Bari Learn, FNP  Date: 06/12/2023 4:35 PM  Virtual Visit via Video Note   I, Bari Learn, connected with  Lakrista Scaduto  ([id number], 1980/05/30) on 06/12/23 at  4:30 PM EST by a video-enabled telemedicine application and verified that I am speaking with the correct person using two identifiers.  Location: Patient: Virtual Visit Location Patient:  Home Provider: Virtual Visit Location Provider: Home Office   I discussed the limitations of evaluation and management by telemedicine and the availability of in person appointments. The patient expressed understanding and agreed to proceed.    History of Present Illness: Kara Vaughn is a 43 y.o. who identifies as a female who was assigned female at birth, and is being seen today for rash on left leg that she noticed yesterday.  HPI: Rash This is a new problem. The current episode started yesterday. The problem has been gradually worsening since onset. The affected locations include the left upper leg. The rash is characterized by pain, redness and blistering. She was exposed to nothing. Pertinent negatives include no congestion, cough, diarrhea, facial edema, fatigue, fever or joint pain. (Burning pain) The treatment provided mild relief.    Problems:  Patient Active Problem List   Diagnosis Date Noted   Vaginal odor 05/26/2023   Screening examination for STI 05/26/2023   History of URI (upper respiratory infection) 07/21/2021   Seasonal allergies 07/21/2021   Acute cough 07/21/2021   Cervical cancer screening 12/04/2019   Upper respiratory infection, viral 10/26/2019   Genetic testing 09/29/2018   Family history of breast cancer    Family history of esophageal cancer    Family history of colon cancer    Family history of colonic polyps    Family history of Lynch syndrome    Family history of BRCA2 gene positive    Routine general medical examination at a health care facility 02/07/2014  Iron deficiency anemia 10/31/2012   Angioedema of lips 09/22/2012   Migraine 09/22/2012    Allergies: No Known Allergies Medications:  Current Outpatient Medications:    valACYclovir  (VALTREX ) 1000 MG tablet, Take 1 tablet (1,000 mg total) by mouth 3 (three) times daily for 10 days., Disp: 30 tablet, Rfl: 0   albuterol  (VENTOLIN  HFA) 108 (90 Base) MCG/ACT inhaler, Inhale 1-2 puffs into the  lungs every 6 (six) hours as needed for wheezing or shortness of breath., Disp: 18 g, Rfl: 2   Cholecalciferol  1.25 MG (50000 UT) capsule, Take 1 capsule (50,000 Units total) by mouth once a week for 8 weeks., Disp: 8 capsule, Rfl: 0   COVID-19 At Home Antigen Test (CARESTART COVID-19 HOME TEST) KIT, use as directed, Disp: 2 kit, Rfl: 0   EPINEPHrine  0.3 mg/0.3 mL IJ SOAJ injection, Inject 0.3 mg into the muscle as needed for anaphylaxis., Disp: 2 each, Rfl: 1   fluconazole  (DIFLUCAN ) 150 MG tablet, Take 1 tablet (150 mg total) by mouth every 3 (three) days as needed. (Patient not taking: Reported on 05/26/2023), Disp: 2 tablet, Rfl: 0   fluconazole  (DIFLUCAN ) 150 MG tablet, Take 1 tablet (150 mg total) by mouth daily., Disp: 1 tablet, Rfl: 1   fluticasone  (FLONASE ) 50 MCG/ACT nasal spray, Place 2 sprays into both nostrils daily., Disp: 16 g, Rfl: 6   levocetirizine (XYZAL ) 5 MG tablet, Take 1 tablet (5 mg total) by mouth every evening., Disp: 30 tablet, Rfl: 2   levonorgestrel  (MIRENA ) 20 MCG/24HR IUD, 1 each by Intrauterine route once., Disp: , Rfl:    Multiple Vitamin (MULTIVITAMIN) tablet, Take 1 tablet by mouth daily., Disp: , Rfl:    SUMAtriptan  (IMITREX ) 100 MG tablet, TAKE 1 TABLET BY MOUTH ONCE FOR 1 DOSE. MAY REPEAT DOSE AFTER 2 HOURS IF HEADACHE PERSISTS OR RECURS., Disp: 9 tablet, Rfl: 0  Observations/Objective: Patient is well-developed, well-nourished in no acute distress.  Resting comfortably  at home.  Head is normocephalic, atraumatic.  No labored breathing.  Speech is clear and coherent with logical content.  Patient is alert and oriented at baseline.  Cluster of blisters on left upper leg, erythemas   Assessment and Plan: 1. Herpes zoster without complication (Primary) - valACYclovir  (VALTREX ) 1000 MG tablet; Take 1 tablet (1,000 mg total) by mouth 3 (three) times daily for 10 days.  Dispense: 30 tablet; Refill: 0  Start valtrex  TID for 7-10 days Avoid immune compromised,  elderly, pregnant women, and babies.  Follow up if symptoms worsen or do not improve   Follow Up Instructions: I discussed the assessment and treatment plan with the patient. The patient was provided an opportunity to ask questions and all were answered. The patient agreed with the plan and demonstrated an understanding of the instructions.  A copy of instructions were sent to the patient via MyChart unless otherwise noted below.     The patient was advised to call back or seek an in-person evaluation if the symptoms worsen or if the condition fails to improve as anticipated.    Bari Learn, FNP

## 2023-06-12 NOTE — Progress Notes (Signed)
   Thank you for the details you included in the comment boxes. Those details are very helpful in determining the best course of treatment for you and help us  to provide the best care.Because Ms. Perri, we recommend that you schedule a Virtual Urgent Care video visit in order for the provider to better assess what is going on.  The provider will be able to give you a more accurate diagnosis and treatment plan if we can more freely discuss your symptoms and with the addition of a virtual examination.   If you change your visit to a video visit, we will bill your insurance (similar to an office visit) and you will not be charged for this e-Visit. You will be able to stay at home and speak with the first available [facility] advanced practice provider. The link to do a video visit is in the drop down Menu tab of your Welcome screen in MyChart.

## 2023-07-20 ENCOUNTER — Other Ambulatory Visit: Payer: Self-pay

## 2023-07-20 ENCOUNTER — Other Ambulatory Visit: Payer: Self-pay | Admitting: Physician Assistant

## 2023-07-20 DIAGNOSIS — B379 Candidiasis, unspecified: Secondary | ICD-10-CM

## 2023-08-02 ENCOUNTER — Other Ambulatory Visit: Payer: Self-pay

## 2023-08-04 ENCOUNTER — Other Ambulatory Visit: Payer: Self-pay

## 2023-08-06 ENCOUNTER — Telehealth: Admitting: Family Medicine

## 2023-08-06 DIAGNOSIS — B029 Zoster without complications: Secondary | ICD-10-CM | POA: Diagnosis not present

## 2023-08-06 MED ORDER — VALACYCLOVIR HCL 1 G PO TABS
1000.0000 mg | ORAL_TABLET | Freq: Three times a day (TID) | ORAL | 0 refills | Status: AC
Start: 2023-08-06 — End: 2023-08-13

## 2023-08-06 NOTE — Patient Instructions (Signed)
 Cline Crock, thank you for joining Reed Pandy, PA-C for today's virtual visit.  While this provider is not your primary care provider (PCP), if your PCP is located in our provider database this encounter information will be shared with them immediately following your visit.   A [facility] MyChart account gives you access to today's visit and all your visits, tests, and labs performed at [facility] " click here if you don't have a [facility] MyChart account or go to [email or web address]  Consent: (Patient) Kara Vaughn provided verbal consent for this virtual visit at the beginning of the encounter.  Current Medications:  Current Outpatient Medications:    valACYclovir (VALTREX) 1000 MG tablet, Take 1 tablet (1,000 mg total) by mouth 3 (three) times daily for 7 days., Disp: 21 tablet, Rfl: 0   albuterol (VENTOLIN HFA) 108 (90 Base) MCG/ACT inhaler, Inhale 1-2 puffs into the lungs every 6 (six) hours as needed for wheezing or shortness of breath., Disp: 18 g, Rfl: 2   Cholecalciferol 1.25 MG (50000 UT) capsule, Take 1 capsule (50,000 Units total) by mouth once a week for 8 weeks., Disp: 8 capsule, Rfl: 0   COVID-19 At Home Antigen Test (CARESTART COVID-19 HOME TEST) KIT, use as directed, Disp: 2 kit, Rfl: 0   EPINEPHrine 0.3 mg/0.3 mL IJ SOAJ injection, Inject 0.3 mg into the muscle as needed for anaphylaxis., Disp: 2 each, Rfl: 1   fluconazole (DIFLUCAN) 150 MG tablet, Take 1 tablet (150 mg total) by mouth every 3 (three) days as needed. (Patient not taking: Reported on 05/26/2023), Disp: 2 tablet, Rfl: 0   fluconazole (DIFLUCAN) 150 MG tablet, Take 1 tablet (150 mg total) by mouth daily., Disp: 1 tablet, Rfl: 1   fluticasone (FLONASE) 50 MCG/ACT nasal spray, Place 2 sprays into both nostrils daily., Disp: 16 g, Rfl: 6   levocetirizine (XYZAL) 5 MG tablet, Take 1 tablet (5 mg total) by mouth every evening., Disp: 30 tablet, Rfl: 2   levonorgestrel (MIRENA) 20 MCG/24HR IUD, 1  each by Intrauterine route once., Disp: , Rfl:    Multiple Vitamin (MULTIVITAMIN) tablet, Take 1 tablet by mouth daily., Disp: , Rfl:    SUMAtriptan (IMITREX) 100 MG tablet, TAKE 1 TABLET BY MOUTH ONCE FOR 1 DOSE. MAY REPEAT DOSE AFTER 2 HOURS IF HEADACHE PERSISTS OR RECURS., Disp: 9 tablet, Rfl: 0   Medications ordered in this encounter:  Meds ordered this encounter  Medications   valACYclovir (VALTREX) 1000 MG tablet    Sig: Take 1 tablet (1,000 mg total) by mouth 3 (three) times daily for 7 days.    Dispense:  21 tablet    Refill:  0     *If you need refills on other medications prior to your next appointment, please contact your pharmacy*  Follow-Up: Call back or seek an in-person evaluation if the symptoms worsen or if the condition fails to improve as anticipated.  [facility] [phone]  Other Instructions Shingles  Shingles, or herpes zoster, is an infection. It gives you a skin rash and blisters. These infected areas may hurt a lot. Shingles only happens if: You've had chickenpox. You've been given a shot called a vaccine to protect you from getting chickenpox. Shingles is rare in this case. What are the causes? Shingles is caused by a germ called the varicella-zoster virus. This is the same germ that causes chickenpox. After you're exposed to the germ, it stays in your body but is dormant. This means it isn't active.  Shingles happens if the germ becomes active again. This can happen years after you're first exposed to the germ. What increases the risk? You may be more likely to get shingles if: You're older than 44 years of age. You're under a lot of stress. You have a weak immune system. The immune system is your body's defense system. It may be weak if: You have human immunodeficiency virus (HIV). You have acquired immunodeficiency syndrome (AIDS). You have cancer. You take medicines that weaken your immune system. These include organ transplant  medicines. What are the signs or symptoms? The first symptoms of shingles may be itching, tingling, or pain. Your skin may feel like it's burning. A few days or weeks later, you'll get a rash. Here's what you can expect: The rash is likely to be on one side of your body. The rash may be shaped like a belt or a band. Over time, it will turn into blisters filled with fluid. The blisters will break open and change into scabs. The scabs will dry up in about 2-3 weeks. You may also have: A fever. Chills. A headache. Nausea. How is this diagnosed? Shingles is diagnosed with a skin exam. A sample called a culture may be taken from one of your blisters and sent to a lab. This will show if you have shingles. How is this treated? The rash may last for several weeks. There's no cure for shingles, but your health care provider may give you medicines. These medicines may: Help with pain. Help with itching. Help with irritation and swelling. Help you get better sooner. Help to prevent long-term problems. If the rash is on your face, you may need to see an eye doctor or an ear, nose, and throat (ENT) doctor. Follow these instructions at home: Medicines Take your medicines only as told by your provider. Put an anti-itch cream or numbing cream on the rash or blisters as told by your provider. Relieving itching and discomfort  To help with itching: Put cold, wet cloths called cold compresses on the rash or blisters. Take a cool bath. Try adding baking soda or dry oatmeal to the water. Do not bathe in hot water. Use calamine lotion on the rash or blisters. You can get this type of lotion at the store. Blister and rash care Keep your rash covered with a loose bandage. Wear loose clothes that don't rub on your rash. Take care of your rash as told by your provider. Make sure you: Wash your hands with soap and water for at least 20 seconds before and after you change your bandage. If you can't use soap  and water, use hand sanitizer. Keep your rash and blisters clean by washing them with mild soap and cool water. Change your bandage. Check your rash every day for signs of infection. Check for: More redness, swelling, or pain. Fluid or blood. Warmth. Pus or a bad smell. Do not scratch your rash. Do not pick at your blisters. To help you not scratch: Keep your fingernails clean and cut short. Try to wear gloves or mittens when you sleep. General instructions Rest. Wash your hands often with soap and water for at least 20 seconds. If you can't use soap and water, use hand sanitizer. Washing your hands lowers your chance of getting a skin infection. Your infection can cause chickenpox in others. If you have blisters that aren't scabs yet, stay away from: Babies. Pregnant people. Children who have eczema. Older people who have organ transplants. People  who have a long-term, or chronic, illness. Anyone who hasn't had chickenpox before. Anyone who hasn't gotten the chickenpox vaccine. How is this prevented? Vaccines are the best way to prevent you from getting chickenpox or shingles. Talk with your provider about getting these shots. Where to find more information Centers for Disease Control and Prevention (CDC): TonerPromos.no Contact a health care provider if: Your pain doesn't get better with medicine. Your pain doesn't get better after the rash heals. You have any signs of infection around the rash. Your rash or blisters get worse. You have a fever or chills. Get help right away if: The rash is on your face or nose. You have pain in your face or by your eye. You lose feeling on one side of your face. You have trouble seeing. You have ear pain or ringing in your ear. This information is not intended to replace advice given to you by your health care provider. Make sure you discuss any questions you have with your health care provider. Document Revised: 01/20/2023 Document Reviewed:  06/04/2022 Elsevier Patient Education  2024 [employer]   If you have been instructed to have an in-person evaluation today at a local Urgent Care facility, please use the link below. It will take you to a list of all of our available [facility], including address, phone number and hours of operation. Please do not delay care.  [facility]  If you or a family member do not have a primary care provider, use the link below to schedule a visit and establish care. When you choose a [facility] primary care physician or advanced practice provider, you gain a long-term partner in health. Find a Primary Care Provider  Learn more about [facility]'s in-office and virtual care options: [facility] - Get Care Now

## 2023-08-06 NOTE — Progress Notes (Signed)
 Virtual Visit Consent   Kara Vaughn, you are scheduled for a virtual visit with a [facility] provider today. Just as with appointments in the office, your consent must be obtained to participate. Your consent will be active for this visit and any virtual visit you may have with one of our providers in the next 365 days. If you have a MyChart account, a copy of this consent can be sent to you electronically.  As this is a virtual visit, video technology does not allow for your provider to perform a traditional examination. This may limit your provider's ability to fully assess your condition. If your provider identifies any concerns that need to be evaluated in person or the need to arrange testing (such as labs, EKG, etc.), we will make arrangements to do so. Although advances in technology are sophisticated, we cannot ensure that it will always work on either your end or our end. If the connection with a video visit is poor, the visit may have to be switched to a telephone visit. With either a video or telephone visit, we are not always able to ensure that we have a secure connection.  By engaging in this virtual visit, you consent to the provision of healthcare and authorize for your insurance to be billed (if applicable) for the services provided during this visit. Depending on your insurance coverage, you may receive a charge related to this service.  I need to obtain your verbal consent now. Are you willing to proceed with your visit today? Brittnee Gaetano has provided verbal consent on 08/06/2023 for a virtual visit (video or telephone). Reed Pandy, [place]  Date: 08/06/2023 11:07 AM   Virtual Visit via Video Note   I, Reed Pandy, connected with  Kara Vaughn  ([id number], [birth year]) on 08/06/23 at 11:00 AM EDT by a video-enabled telemedicine application and verified that I am speaking with the correct person using two identifiers.  Location: Patient: Virtual Visit Location Patient:  Home Provider: Virtual Visit Location Provider: Home Office   I discussed the limitations of evaluation and management by telemedicine and the availability of in person appointments. The patient expressed understanding and agreed to proceed.    History of Present Illness: Kara Vaughn is a 43 y.o. who identifies as a female who was assigned female at birth, and is being seen today for c/o 2 months ago she had a shingles outbreak and did an evisit but a couple of days ago has a bite that hurts and burns and looks blistery very similar to her previous shingles outbreak on her thigh.  Pt is also sore to the touch.  Pt states she is under stress as she works two jobs and takes care of her daughter. Pt states the area looks glossy and shiny and has clear fluid coming out from what she thinks. Pt states the outbreak started yesterday. Pt does not report fever or chills.   HPI: HPI  Problems:  Patient Active Problem List   Diagnosis Date Noted   Vaginal odor 05/26/2023   Screening examination for STI 05/26/2023   History of URI (upper respiratory infection) 07/21/2021   Seasonal allergies 07/21/2021   Acute cough 07/21/2021   Cervical cancer screening 12/04/2019   Upper respiratory infection, viral 10/26/2019   Genetic testing 09/29/2018   Family history of breast cancer    Family history of esophageal cancer    Family history of colon cancer    Family history of colonic polyps    Family history of  Lynch syndrome    Family history of BRCA2 gene positive    Routine general medical examination at a health care facility 02/07/2014   Iron deficiency anemia 10/31/2012   Angioedema of lips 09/22/2012   Migraine 09/22/2012    Allergies: No Known Allergies Medications:  Current Outpatient Medications:    valACYclovir (VALTREX) 1000 MG tablet, Take 1 tablet (1,000 mg total) by mouth 3 (three) times daily for 7 days., Disp: 21 tablet, Rfl: 0   albuterol (VENTOLIN HFA) 108 (90 Base) MCG/ACT inhaler,  Inhale 1-2 puffs into the lungs every 6 (six) hours as needed for wheezing or shortness of breath., Disp: 18 g, Rfl: 2   Cholecalciferol 1.25 MG (50000 UT) capsule, Take 1 capsule (50,000 Units total) by mouth once a week for 8 weeks., Disp: 8 capsule, Rfl: 0   COVID-19 At Home Antigen Test (CARESTART COVID-19 HOME TEST) KIT, use as directed, Disp: 2 kit, Rfl: 0   EPINEPHrine 0.3 mg/0.3 mL IJ SOAJ injection, Inject 0.3 mg into the muscle as needed for anaphylaxis., Disp: 2 each, Rfl: 1   fluconazole (DIFLUCAN) 150 MG tablet, Take 1 tablet (150 mg total) by mouth every 3 (three) days as needed. (Patient not taking: Reported on 05/26/2023), Disp: 2 tablet, Rfl: 0   fluconazole (DIFLUCAN) 150 MG tablet, Take 1 tablet (150 mg total) by mouth daily., Disp: 1 tablet, Rfl: 1   fluticasone (FLONASE) 50 MCG/ACT nasal spray, Place 2 sprays into both nostrils daily., Disp: 16 g, Rfl: 6   levocetirizine (XYZAL) 5 MG tablet, Take 1 tablet (5 mg total) by mouth every evening., Disp: 30 tablet, Rfl: 2   levonorgestrel (MIRENA) 20 MCG/24HR IUD, 1 each by Intrauterine route once., Disp: , Rfl:    Multiple Vitamin (MULTIVITAMIN) tablet, Take 1 tablet by mouth daily., Disp: , Rfl:    SUMAtriptan (IMITREX) 100 MG tablet, TAKE 1 TABLET BY MOUTH ONCE FOR 1 DOSE. MAY REPEAT DOSE AFTER 2 HOURS IF HEADACHE PERSISTS OR RECURS., Disp: 9 tablet, Rfl: 0  Observations/Objective: Patient is well-developed, well-nourished in no acute distress.  Resting comfortably at home.  Head is normocephalic, atraumatic.  No labored breathing.  Speech is clear and coherent with logical content.  Patient is alert and oriented at baseline.  Erythematous vesicles noted to behind neck area   Assessment and Plan: 1. Herpes zoster without complication (Primary) - valACYclovir (VALTREX) 1000 MG tablet; Take 1 tablet (1,000 mg total) by mouth 3 (three) times daily for 7 days.  Dispense: 21 tablet; Refill: 0  -Will treat with Valtrex -Advised  Pt if symptoms do not improve, proceed to in person urgent care or PCP for further evaluation.   Follow Up Instructions: I discussed the assessment and treatment plan with the patient. The patient was provided an opportunity to ask questions and all were answered. The patient agreed with the plan and demonstrated an understanding of the instructions.  A copy of instructions were sent to the patient via MyChart unless otherwise noted below.     The patient was advised to call back or seek an in-person evaluation if the symptoms worsen or if the condition fails to improve as anticipated.    Reed Pandy, PA-C

## 2023-08-23 ENCOUNTER — Other Ambulatory Visit: Payer: Self-pay

## 2023-10-11 ENCOUNTER — Other Ambulatory Visit: Payer: Self-pay

## 2023-10-11 ENCOUNTER — Other Ambulatory Visit (HOSPITAL_COMMUNITY)
Admission: RE | Admit: 2023-10-11 | Discharge: 2023-10-11 | Disposition: A | Discharge status: Home / Self Care | Source: Ambulatory Visit | Attending: Family | Admitting: Family

## 2023-10-11 ENCOUNTER — Ambulatory Visit (INDEPENDENT_AMBULATORY_CARE_PROVIDER_SITE_OTHER): Admitting: Family

## 2023-10-11 ENCOUNTER — Encounter: Payer: Self-pay | Admitting: Family

## 2023-10-11 VITALS — BP 118/76 | HR 87 | Temp 98.2°F | Ht 69.0 in | Wt 251.2 lb

## 2023-10-11 DIAGNOSIS — T783XXA Angioneurotic edema, initial encounter: Secondary | ICD-10-CM | POA: Diagnosis not present

## 2023-10-11 DIAGNOSIS — Z124 Encounter for screening for malignant neoplasm of cervix: Secondary | ICD-10-CM | POA: Diagnosis present

## 2023-10-11 DIAGNOSIS — J302 Other seasonal allergic rhinitis: Secondary | ICD-10-CM | POA: Diagnosis not present

## 2023-10-11 DIAGNOSIS — Z0000 Encounter for general adult medical examination without abnormal findings: Secondary | ICD-10-CM

## 2023-10-11 LAB — CBC WITH DIFFERENTIAL/PLATELET
Basophils Absolute: 0.1 10*3/uL (ref 0.0–0.1)
Basophils Relative: 0.8 % (ref 0.0–3.0)
Eosinophils Absolute: 0.2 10*3/uL (ref 0.0–0.7)
Eosinophils Relative: 2.9 % (ref 0.0–5.0)
HCT: 40.1 % (ref 36.0–46.0)
Hemoglobin: 13.4 g/dL (ref 12.0–15.0)
Lymphocytes Relative: 20.5 % (ref 12.0–46.0)
Lymphs Abs: 1.5 10*3/uL (ref 0.7–4.0)
MCHC: 33.5 g/dL (ref 30.0–36.0)
MCV: 89 fl (ref 78.0–100.0)
Monocytes Absolute: 0.5 10*3/uL (ref 0.1–1.0)
Monocytes Relative: 7.3 % (ref 3.0–12.0)
Neutro Abs: 4.9 10*3/uL (ref 1.4–7.7)
Neutrophils Relative %: 68.5 % (ref 43.0–77.0)
Platelets: 262 10*3/uL (ref 150.0–400.0)
RBC: 4.5 Mil/uL (ref 3.87–5.11)
RDW: 13.4 % (ref 11.5–15.5)
WBC: 7.1 10*3/uL (ref 4.0–10.5)

## 2023-10-11 LAB — LIPID PANEL
Cholesterol: 138 mg/dL (ref 0–200)
HDL: 55 mg/dL (ref >39.00)
LDL Cholesterol: 70 mg/dL (ref 0–99)
NonHDL: 82.97
Total CHOL/HDL Ratio: 3
Triglycerides: 63 mg/dL (ref 0.0–149.0)
VLDL: 12.6 mg/dL (ref 0.0–40.0)

## 2023-10-11 LAB — COMPREHENSIVE METABOLIC PANEL WITH GFR
ALT: 18 U/L (ref 0–35)
AST: 19 U/L (ref 0–37)
Albumin: 4.4 g/dL (ref 3.5–5.2)
Alkaline Phosphatase: 45 U/L (ref 39–117)
BUN: 12 mg/dL (ref 6–23)
CO2: 30 meq/L (ref 19–32)
Calcium: 8.7 mg/dL (ref 8.4–10.5)
Chloride: 100 meq/L (ref 96–112)
Creatinine, Ser: 0.65 mg/dL (ref 0.40–1.20)
GFR: 107.55 mL/min (ref >60.00)
Glucose, Bld: 84 mg/dL (ref 70–99)
Potassium: 3.9 meq/L (ref 3.5–5.1)
Sodium: 136 meq/L (ref 135–145)
Total Bilirubin: 0.8 mg/dL (ref 0.2–1.2)
Total Protein: 6.4 g/dL (ref 6.0–8.3)

## 2023-10-11 LAB — HEMOGLOBIN A1C: Hgb A1c MFr Bld: 5.6 % (ref 4.6–6.5)

## 2023-10-11 MED ORDER — FLUTICASONE PROPIONATE 50 MCG/ACT NA SUSP
2.0000 | Freq: Every day | NASAL | 6 refills | Status: AC
  Filled 2023-10-11: qty 16, 30d supply, fill #0
  Filled 2024-05-17: qty 16, 30d supply, fill #1

## 2023-10-11 MED ORDER — EPINEPHRINE 0.3 MG/0.3ML IJ SOAJ
0.3000 mg | INTRAMUSCULAR | 1 refills | Status: AC | PRN
  Filled 2023-10-11: qty 2, 1d supply, fill #0
  Filled 2024-05-17: qty 2, 1d supply, fill #1

## 2023-10-11 MED ORDER — LEVOCETIRIZINE DIHYDROCHLORIDE 5 MG PO TABS
5.0000 mg | ORAL_TABLET | Freq: Every evening | ORAL | 3 refills | Status: AC
  Filled 2023-10-11: qty 90, 90d supply, fill #0

## 2023-10-11 MED ORDER — SUMATRIPTAN SUCCINATE 100 MG PO TABS
100.0000 mg | ORAL_TABLET | ORAL | 0 refills | Status: AC | PRN
  Filled 2023-10-11: qty 9, 9d supply, fill #0

## 2023-10-11 NOTE — Progress Notes (Unsigned)
 Assessment & Plan:  There are no diagnoses linked to this encounter.   Return precautions given.   Risks, benefits, and alternatives of the medications and treatment plan prescribed today were discussed, and patient expressed understanding.   Education regarding symptom management and diagnosis given to patient on AVS either electronically or printed.  No follow-ups on file.  Bascom Bossier, FNP  Subjective:    Patient ID: Kara Vaughn, female    DOB: Jan 13, 1981, 43 y.o.   MRN: [id number]  CC: Kara Vaughn is a 43 y.o. female who presents today for physical exam.    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 Light activities and light hiking.   No scuba diving.   NO rash Denies CP , sob  Colorectal Cancer Screening: family h/o colon polyps. Denies constipation, rectal bleeding Breast Cancer Screening: Mammogram due Cervical Cancer Screening: due; she has IUD. She states not expired.          Tetanus - UTD  Exercise: Gets regular exercise, walking   Alcohol use:  none Smoking/tobacco use: Nonsmoker.    Health Maintenance  Topic Date Due   COVID-19 Vaccine (4 - 2024-25 season) 01/02/2023   Flu Shot  12/02/2023   DTaP/Tdap/Td vaccine (2 - Td or Tdap) 07/02/2027   Pap with HPV screening  09/22/2027   Hepatitis C Screening  Completed   HIV Screening  Completed   HPV Vaccine  Aged Out   Meningitis B Vaccine  Aged Out    ALLERGIES: Patient has no known allergies.  Current Outpatient Medications on File Prior to Visit  Medication Sig Dispense Refill   albuterol  (VENTOLIN  HFA) 108 (90 Base) MCG/ACT inhaler Inhale 1-2 puffs into the lungs every 6 (six) hours as needed for wheezing or shortness of breath. 18 g 2   Cholecalciferol  1.25 MG (50000 UT) capsule Take 1 capsule (50,000 Units total) by mouth once a week for 8 weeks. 8 capsule 0   COVID-19  At Home Antigen Test (CARESTART COVID-19 HOME TEST) KIT use as directed 2 kit 0   EPINEPHrine  0.3 mg/0.3 mL IJ SOAJ injection Inject 0.3 mg into the muscle as needed for anaphylaxis. 2 each 1   fluconazole  (DIFLUCAN ) 150 MG tablet Take 1 tablet (150 mg total) by mouth every 3 (three) days as needed. 2 tablet 0   fluconazole  (DIFLUCAN ) 150 MG tablet Take 1 tablet (150 mg total) by mouth daily. 1 tablet 1   fluticasone  (FLONASE ) 50 MCG/ACT nasal spray Place 2 sprays into both nostrils daily. 16 g 6   levocetirizine (XYZAL ) 5 MG tablet Take 1 tablet (5 mg total) by mouth every evening. 30 tablet 2   levonorgestrel  (MIRENA ) 20 MCG/24HR IUD 1 each by Intrauterine route once.     Multiple Vitamin (MULTIVITAMIN) tablet Take 1 tablet by mouth daily.     SUMAtriptan  (IMITREX ) 100 MG tablet TAKE 1 TABLET BY MOUTH ONCE FOR 1 DOSE. MAY REPEAT DOSE AFTER 2 HOURS IF HEADACHE PERSISTS OR RECURS. 9 tablet 0   No current facility-administered medications on file prior to visit.    Review of Systems  Constitutional:  Negative for chills, fever and unexpected weight change.  HENT:  Negative for congestion.   Respiratory:  Negative for cough.   Cardiovascular:  Negative for chest pain, palpitations and leg swelling.  Gastrointestinal:  Negative for nausea and vomiting.  Musculoskeletal:  Negative for arthralgias and myalgias.  Skin:  Negative for rash.  Neurological:  Negative for headaches.  Hematological:  Negative for adenopathy.  Psychiatric/Behavioral:  Negative for confusion.       Objective:    BP 118/76   Pulse 87   Temp 98.2 F (36.8 C) (Oral)   Ht 5\' 9"  (1.753 m)   Wt 251 lb 3.2 oz (113.9 kg)   LMP  (LMP Unknown)   SpO2 96%   BMI 37.10 kg/m   BP Readings from Last 3 Encounters:  10/11/23 118/76  05/26/23 116/76  09/22/22 126/78   Wt Readings from Last 3 Encounters:  10/11/23 251 lb 3.2 oz (113.9 kg)  05/26/23 234 lb 3.2 oz (106.2 kg)  09/22/22 240 lb 9.6 oz (109.1 kg)     Physical Exam Vitals reviewed.  Constitutional:      Appearance: Normal appearance. She is well-developed.  Eyes:     Conjunctiva/sclera: Conjunctivae normal.  Neck:     Thyroid : No thyroid  mass or thyromegaly.  Cardiovascular:     Rate and Rhythm: Normal rate and regular rhythm.     Pulses: Normal pulses.     Heart sounds: Normal heart sounds.  Pulmonary:     Effort: Pulmonary effort is normal.     Breath sounds: Normal breath sounds. No wheezing, rhonchi or rales.  Chest:  Breasts:    Breasts are symmetrical.     Right: No inverted nipple, mass, nipple discharge, skin change or tenderness.     Left: No inverted nipple, mass, nipple discharge, skin change or tenderness.  Abdominal:     General: Bowel sounds are normal. There is no distension.     Palpations: Abdomen is soft. Abdomen is not rigid. There is no fluid wave or mass.     Tenderness: There is no abdominal tenderness. There is no guarding or rebound.  Genitourinary:    Cervix: No cervical motion tenderness, discharge or friability.     Uterus: Not enlarged, not fixed and not tender.      Adnexa:        Right: No mass, tenderness or fullness.         Left: No mass, tenderness or fullness.       Comments: Pap performed. 2 IUD string coming from cervical os.  No CMT. Unable to appreciated ovaries. Lymphadenopathy:     Head:     Right side of head: No submental, submandibular, tonsillar, preauricular, posterior auricular or occipital adenopathy.     Left side of head: No submental, submandibular, tonsillar, preauricular, posterior auricular or occipital adenopathy.     Cervical:     Right cervical: No superficial, deep or posterior cervical adenopathy.    Left cervical: No superficial, deep or posterior cervical adenopathy.     Upper Body:     Right upper body: No pectoral adenopathy.     Left upper body: No pectoral adenopathy.  Skin:    General: Skin is warm and dry.  Neurological:     Mental Status: She is  alert.  Psychiatric:        Speech: Speech normal.        Behavior: Behavior normal.        Thought Content: Thought content normal.

## 2023-10-11 NOTE — Patient Instructions (Signed)
 Please ensure IUD is not expired with [facility] You may sent us  a note in regards to when will expire so I can put a note on your chart as well  Please call  and schedule your 3D mammogram and /or bone density scan as we discussed.   [facility]  ( new location in 2023)  [address], [place], [place] [address]  [place], [place]  [phone]   Have a great summer!  Health Maintenance, Female Adopting a healthy lifestyle and getting preventive care are important in promoting health and wellness. Ask your health care provider about: The right schedule for you to have regular tests and exams. Things you can do on your own to prevent diseases and keep yourself healthy. What should I know about diet, weight, and exercise? Eat a healthy diet  Eat a diet that includes plenty of vegetables, fruits, low-fat dairy products, and lean protein. Do not eat a lot of foods that are high in solid fats, added sugars, or sodium. Maintain a healthy weight Body mass index (BMI) is used to identify weight problems. It estimates body fat based on height and weight. Your health care provider can help determine your BMI and help you achieve or maintain a healthy weight. Get regular exercise Get regular exercise. This is one of the most important things you can do for your health. Most adults should: Exercise for at least 150 minutes each week. The exercise should increase your heart rate and make you sweat (moderate-intensity exercise). Do strengthening exercises at least twice a week. This is in addition to the moderate-intensity exercise. Spend less time sitting. Even light physical activity can be beneficial. Watch cholesterol and blood lipids Have your blood tested for lipids and cholesterol at 44 years of age, then have this test every 5 years. Have your cholesterol levels checked more often if: Your lipid or cholesterol levels are high. You are older than 44 years of  age. You are at high risk for heart disease. What should I know about cancer screening? Depending on your health history and family history, you may need to have cancer screening at various ages. This may include screening for: Breast cancer. Cervical cancer. Colorectal cancer. Skin cancer. Lung cancer. What should I know about heart disease, diabetes, and high blood pressure? Blood pressure and heart disease High blood pressure causes heart disease and increases the risk of stroke. This is more likely to develop in people who have high blood pressure readings or are overweight. Have your blood pressure checked: Every 3-5 years if you are 35-61 years of age. Every year if you are 78 years old or older. Diabetes Have regular diabetes screenings. This checks your fasting blood sugar level. Have the screening done: Once every three years after age 51 if you are at a normal weight and have a low risk for diabetes. More often and at a younger age if you are overweight or have a high risk for diabetes. What should I know about preventing infection? Hepatitis B If you have a higher risk for hepatitis B, you should be screened for this virus. Talk with your health care provider to find out if you are at risk for hepatitis B infection. Hepatitis C Testing is recommended for: Everyone born from [birth year] through 1966. Anyone with known risk factors for hepatitis C. Sexually transmitted infections (STIs) Get screened for STIs, including gonorrhea and chlamydia, if: You are sexually active and are younger than 44 years of age.  You are older than 44 years of age and your health care provider tells you that you are at risk for this type of infection. Your sexual activity has changed since you were last screened, and you are at increased risk for chlamydia or gonorrhea. Ask your health care provider if you are at risk. Ask your health care provider about whether you are at high risk for HIV. Your health  care provider may recommend a prescription medicine to help prevent HIV infection. If you choose to take medicine to prevent HIV, you should first get tested for HIV. You should then be tested every 3 months for as long as you are taking the medicine. Pregnancy If you are about to stop having your period (premenopausal) and you may become pregnant, seek counseling before you get pregnant. Take 400 to 800 micrograms (mcg) of folic acid every day if you become pregnant. Ask for birth control (contraception) if you want to prevent pregnancy. Osteoporosis and menopause Osteoporosis is a disease in which the bones lose minerals and strength with aging. This can result in bone fractures. If you are 59 years old or older, or if you are at risk for osteoporosis and fractures, ask your health care provider if you should: Be screened for bone loss. Take a calcium or vitamin D  supplement to lower your risk of fractures. Be given hormone replacement therapy (HRT) to treat symptoms of menopause. Follow these instructions at home: Alcohol use Do not drink alcohol if: Your health care provider tells you not to drink. You are pregnant, may be pregnant, or are planning to become pregnant. If you drink alcohol: Limit how much you have to: 0-1 drink a day. Know how much alcohol is in your drink. In the [place] one drink equals one 12 oz bottle of beer (355 mL), one 5 oz glass of wine (148 mL), or one 1 oz glass of hard liquor (44 mL). Lifestyle Do not use any products that contain nicotine or tobacco. These products include cigarettes, chewing tobacco, and vaping devices, such as e-cigarettes. If you need help quitting, ask your health care provider. Do not use street drugs. Do not share needles. Ask your health care provider for help if you need support or information about quitting drugs. General instructions Schedule regular health, dental, and eye exams. Stay current with your vaccines. Tell your health  care provider if: You often feel depressed. You have ever been abused or do not feel safe at home. Summary Adopting a healthy lifestyle and getting preventive care are important in promoting health and wellness. Follow your health care provider's instructions about healthy diet, exercising, and getting tested or screened for diseases. Follow your health care provider's instructions on monitoring your cholesterol and blood pressure. This information is not intended to replace advice given to you by your health care provider. Make sure you discuss any questions you have with your health care provider. Document Revised: 09/08/2020 Document Reviewed: 09/08/2020 Elsevier Patient Education  2024 [employer].

## 2023-10-12 ENCOUNTER — Other Ambulatory Visit: Payer: Self-pay

## 2023-10-12 ENCOUNTER — Ambulatory Visit: Payer: Self-pay | Admitting: Family

## 2023-10-12 LAB — CYTOLOGY - PAP
Comment: NEGATIVE
Diagnosis: NEGATIVE
High risk HPV: NEGATIVE

## 2023-10-12 NOTE — Assessment & Plan Note (Addendum)
 Clinical breast exam performed today.  Pap smear obtained today.  Encouraged continued exercise.  Completed form for [employer] .  She declined earlier colonoscopy due to family history of colon polyps.  She would like to start colonoscopy at age 43

## 2023-10-13 ENCOUNTER — Other Ambulatory Visit: Payer: Self-pay

## 2023-10-13 LAB — TSH: TSH: 1.96 u[IU]/mL (ref 0.35–5.50)

## 2023-10-13 LAB — VITAMIN D 25 HYDROXY (VIT D DEFICIENCY, FRACTURES): VITD: 24.51 ng/mL — ABNORMAL LOW (ref 30.00–100.00)

## 2023-10-14 ENCOUNTER — Other Ambulatory Visit: Payer: Self-pay

## 2023-11-03 ENCOUNTER — Ambulatory Visit
Admission: RE | Admit: 2023-11-03 | Discharge: 2023-11-03 | Disposition: A | Discharge status: Home / Self Care | Source: Ambulatory Visit | Attending: Family | Admitting: Family

## 2023-11-03 ENCOUNTER — Encounter

## 2023-11-03 ENCOUNTER — Other Ambulatory Visit

## 2023-11-03 DIAGNOSIS — Z0000 Encounter for general adult medical examination without abnormal findings: Secondary | ICD-10-CM | POA: Insufficient documentation

## 2023-11-20 ENCOUNTER — Encounter: Payer: Self-pay | Admitting: Family

## 2023-11-21 ENCOUNTER — Other Ambulatory Visit: Payer: Self-pay | Admitting: Family

## 2023-11-21 ENCOUNTER — Other Ambulatory Visit: Payer: Self-pay

## 2023-11-21 DIAGNOSIS — N898 Other specified noninflammatory disorders of vagina: Secondary | ICD-10-CM

## 2023-11-21 MED ORDER — METRONIDAZOLE 500 MG PO TABS
500.0000 mg | ORAL_TABLET | Freq: Two times a day (BID) | ORAL | 0 refills | Status: AC
Start: 2023-11-21 — End: 2023-11-29
  Filled 2023-11-21: qty 14, 7d supply, fill #0

## 2023-11-21 NOTE — Telephone Encounter (Signed)
 Noted

## 2024-04-04 ENCOUNTER — Telehealth

## 2024-04-04 DIAGNOSIS — B029 Zoster without complications: Secondary | ICD-10-CM

## 2024-04-04 MED ORDER — VALACYCLOVIR HCL 1 G PO TABS: 1000.0000 mg | ORAL_TABLET | Freq: Three times a day (TID) | ORAL | 0 refills | Status: AC

## 2024-04-04 MED ORDER — GABAPENTIN 100 MG PO CAPS: 100.0000 mg | ORAL_CAPSULE | Freq: Two times a day (BID) | ORAL | 0 refills | Status: DC

## 2024-04-04 NOTE — Progress Notes (Signed)
 Virtual Visit Consent   Cleaster Shiffer, you are scheduled for a virtual visit with a [facility] provider today. Just as with appointments in the office, your consent must be obtained to participate. Your consent will be active for this visit and any virtual visit you may have with one of our providers in the next 365 days. If you have a MyChart account, a copy of this consent can be sent to you electronically.  As this is a virtual visit, video technology does not allow for your provider to perform a traditional examination. This may limit your provider's ability to fully assess your condition. If your provider identifies any concerns that need to be evaluated in person or the need to arrange testing (such as labs, EKG, etc.), we will make arrangements to do so. Although advances in technology are sophisticated, we cannot ensure that it will always work on either your end or our end. If the connection with a video visit is poor, the visit may have to be switched to a telephone visit. With either a video or telephone visit, we are not always able to ensure that we have a secure connection.  By engaging in this virtual visit, you consent to the provision of healthcare and authorize for your insurance to be billed (if applicable) for the services provided during this visit. Depending on your insurance coverage, you may receive a charge related to this service.  I need to obtain your verbal consent now. Are you willing to proceed with your visit today? Sameerah Nachtigal has provided verbal consent on 04/04/2024 for a virtual visit (video or telephone). Elsie Velma Lunger, [place]  Date: 04/04/2024 7:59 AM   Virtual Visit via Video Note   I, Elsie Velma Lunger, connected with  Tereka Thorley  ([id number], [birth year]) on 04/04/24 at  7:45 AM EST by a video-enabled telemedicine application and verified that I am speaking with the correct person using two identifiers.  Location: Patient: Virtual Visit Location  Patient: Home Provider: Virtual Visit Location Provider: Home Office   I discussed the limitations of evaluation and management by telemedicine and the availability of in person appointments. The patient expressed understanding and agreed to proceed.    History of Present Illness: Avereigh Spainhower is a 43 y.o. who identifies as a female who was assigned female at birth, and is being seen today for concern of possible shingles rash to the back of neck starting 2 days ago. Notes a painful cluster of blisters with sensitivity and tenderness on the back, left side of her neck. Denies any trauma or injury. Has history of shingles both on her L thigh and on the neck previously. .Notes increased stressors with work and home. Denies fever, chills.   HPI: HPI  Problems:  Patient Active Problem List   Diagnosis Date Noted   Vaginal odor 05/26/2023   Screening examination for STI 05/26/2023   History of URI (upper respiratory infection) 07/21/2021   Seasonal allergies 07/21/2021   Acute cough 07/21/2021   Cervical cancer screening 12/04/2019   Upper respiratory infection, viral 10/26/2019   Genetic testing 09/29/2018   Family history of breast cancer    Family history of esophageal cancer    Family history of colon cancer    Family history of colonic polyps    Family history of Lynch syndrome    Family history of BRCA2 gene positive    Routine general medical examination at a health care facility 02/07/2014   Iron deficiency anemia 10/31/2012  Angioedema of lips 09/22/2012   Migraine 09/22/2012    Allergies: No Known Allergies Medications:  Current Outpatient Medications:    gabapentin (NEURONTIN) 100 MG capsule, Take 1 capsule (100 mg total) by mouth 2 (two) times daily., Disp: 30 capsule, Rfl: 0   valACYclovir  (VALTREX ) 1000 MG tablet, Take 1 tablet (1,000 mg total) by mouth 3 (three) times daily for 7 days., Disp: 21 tablet, Rfl: 0   COVID-19 At Home Antigen Test ([facility] COVID-19 HOME  TEST) KIT, use as directed, Disp: 2 kit, Rfl: 0   EPINEPHrine  0.3 mg/0.3 mL IJ SOAJ injection, Inject 0.3 mg into the muscle as needed for anaphylaxis., Disp: 2 each, Rfl: 1   fluconazole  (DIFLUCAN ) 150 MG tablet, Take 1 tablet (150 mg total) by mouth daily., Disp: 1 tablet, Rfl: 1   fluticasone  (FLONASE ) 50 MCG/ACT nasal spray, Place 2 sprays into both nostrils daily., Disp: 16 g, Rfl: 6   levocetirizine (XYZAL ) 5 MG tablet, Take 1 tablet (5 mg total) by mouth every evening., Disp: 90 tablet, Rfl: 3   levonorgestrel  (MIRENA ) 20 MCG/24HR IUD, 1 each by Intrauterine route once., Disp: , Rfl:    Multiple Vitamin (MULTIVITAMIN) tablet, Take 1 tablet by mouth daily., Disp: , Rfl:    SUMAtriptan  (IMITREX ) 100 MG tablet, TAKE 1 TABLET BY MOUTH ONCE FOR 1 DOSE. MAY REPEAT DOSE AFTER 2 HOURS IF HEADACHE PERSISTS OR RECURS., Disp: 9 tablet, Rfl: 0  Observations/Objective: Patient is well-developed, well-nourished in no acute distress.  Resting comfortably  at home.  Head is normocephalic, atraumatic.  No labored breathing.  Speech is clear and coherent with logical content.  Patient is alert and oriented at baseline.  Cluster of vesicular lesions noted on the back of neck in the midline, extending to the left side of the neck only.  Assessment and Plan: 1. Herpes zoster without complication (Primary) - valACYclovir  (VALTREX ) 1000 MG tablet; Take 1 tablet (1,000 mg total) by mouth 3 (three) times daily for 7 days.  Dispense: 21 tablet; Refill: 0 - gabapentin (NEURONTIN) 100 MG capsule; Take 1 capsule (100 mg total) by mouth 2 (two) times daily.  Dispense: 30 capsule; Refill: 0  Supportive measures and OTC medications reviewed. Start Valtrex  and Gabapentin per orders. Skin care discussed.  She is aware to keep covered when out in public and is contagious until blisters have fully scabbed over. In person follow-up precautions reviewed.  Follow Up Instructions: I discussed the assessment and  treatment plan with the patient. The patient was provided an opportunity to ask questions and all were answered. The patient agreed with the plan and demonstrated an understanding of the instructions.  A copy of instructions were sent to the patient via MyChart unless otherwise noted below.   The patient was advised to call back or seek an in-person evaluation if the symptoms worsen or if the condition fails to improve as anticipated.    Elsie Velma Lunger, PA-C

## 2024-04-04 NOTE — Patient Instructions (Signed)
 Lauraine Monte, thank you for joining Elsie Velma Lunger, PA-C for today's virtual visit.  While this provider is not your primary care provider (PCP), if your PCP is located in our provider database this encounter information will be shared with them immediately following your visit.   A [facility] MyChart account gives you access to today's visit and all your visits, tests, and labs performed at [facility]  click here if you don't have a [facility] MyChart account or go to [email or web address]  Consent: (Patient) Kara Vaughn provided verbal consent for this virtual visit at the beginning of the encounter.  Current Medications:  Current Outpatient Medications:    COVID-19 At Home Antigen Test [facility] COVID-19 HOME TEST) KIT, use as directed, Disp: 2 kit, Rfl: 0   EPINEPHrine  0.3 mg/0.3 mL IJ SOAJ injection, Inject 0.3 mg into the muscle as needed for anaphylaxis., Disp: 2 each, Rfl: 1   fluconazole  (DIFLUCAN ) 150 MG tablet, Take 1 tablet (150 mg total) by mouth daily., Disp: 1 tablet, Rfl: 1   fluticasone  (FLONASE ) 50 MCG/ACT nasal spray, Place 2 sprays into both nostrils daily., Disp: 16 g, Rfl: 6   levocetirizine (XYZAL ) 5 MG tablet, Take 1 tablet (5 mg total) by mouth every evening., Disp: 90 tablet, Rfl: 3   levonorgestrel  (MIRENA ) 20 MCG/24HR IUD, 1 each by Intrauterine route once., Disp: , Rfl:    Multiple Vitamin (MULTIVITAMIN) tablet, Take 1 tablet by mouth daily., Disp: , Rfl:    SUMAtriptan  (IMITREX ) 100 MG tablet, TAKE 1 TABLET BY MOUTH ONCE FOR 1 DOSE. MAY REPEAT DOSE AFTER 2 HOURS IF HEADACHE PERSISTS OR RECURS., Disp: 9 tablet, Rfl: 0   Medications ordered in this encounter:  No orders of the defined types were placed in this encounter.    *If you need refills on other medications prior to your next appointment, please contact your pharmacy*  Follow-Up: Call back or seek an in-person evaluation if the symptoms worsen or if the condition fails to  improve as anticipated.  [facility] [phone]  Other Instructions Shingles  Shingles, or herpes zoster, is an infection. It gives you a skin rash and blisters. These infected areas may hurt a lot. Shingles only happens if: You've had chickenpox. You've been given a shot called a vaccine to protect you from getting chickenpox. Shingles is rare in this case. What are the causes? Shingles is caused by a germ called the varicella-zoster virus. This is the same germ that causes chickenpox. After you're exposed to the germ, it stays in your body but is dormant. This means it isn't active. Shingles happens if the germ becomes active again. This can happen years after you're first exposed to the germ. What increases the risk? You may be more likely to get shingles if: You're older than 44 years of age. You're under a lot of stress. You have a weak immune system. The immune system is your body's defense system. It may be weak if: You have human immunodeficiency virus (HIV). You have acquired immunodeficiency syndrome (AIDS). You have cancer. You take medicines that weaken your immune system. These include organ transplant medicines. What are the signs or symptoms? The first symptoms of shingles may be itching, tingling, or pain. Your skin may feel like it's burning. A few days or weeks later, you'll get a rash. Here's what you can expect: The rash is likely to be on one side of your body. The rash may be shaped like a belt or a  band. Over time, it will turn into blisters filled with fluid. The blisters will break open and change into scabs. The scabs will dry up in about 2-3 weeks. You may also have: A fever. Chills. A headache. Nausea. How is this diagnosed? Shingles is diagnosed with a skin exam. A sample called a culture may be taken from one of your blisters and sent to a lab. This will show if you have shingles. How is this treated? The rash may last for several  weeks. There's no cure for shingles, but your health care provider may give you medicines. These medicines may: Help with pain. Help with itching. Help with irritation and swelling. Help you get better sooner. Help to prevent long-term problems. If the rash is on your face, you may need to see an eye doctor or an ear, nose, and throat (ENT) doctor. Follow these instructions at home: Medicines Take your medicines only as told by your provider. Put an anti-itch cream or numbing cream on the rash or blisters as told by your provider. Relieving itching and discomfort  To help with itching: Put cold, wet cloths called cold compresses on the rash or blisters. Take a cool bath. Try adding baking soda or dry oatmeal to the water. Do not bathe in hot water. Use calamine lotion on the rash or blisters. You can get this type of lotion at the store. Blister and rash care Keep your rash covered with a loose bandage. Wear loose clothes that don't rub on your rash. Take care of your rash as told by your provider. Make sure you: Wash your hands with soap and water for at least 20 seconds before and after you change your bandage. If you can't use soap and water, use hand sanitizer. Keep your rash and blisters clean by washing them with mild soap and cool water. Change your bandage. Check your rash every day for signs of infection. Check for: More redness, swelling, or pain. Fluid or blood. Warmth. Pus or a bad smell. Do not scratch your rash. Do not pick at your blisters. To help you not scratch: Keep your fingernails clean and cut short. Try to wear gloves or mittens when you sleep. General instructions Rest. Wash your hands often with soap and water for at least 20 seconds. If you can't use soap and water, use hand sanitizer. Washing your hands lowers your chance of getting a skin infection. Your infection can cause chickenpox in others. If you have blisters that aren't scabs yet, stay away  from: Babies. Pregnant people. Children who have eczema. Older people who have organ transplants. People who have a long-term, or chronic, illness. Anyone who hasn't had chickenpox before. Anyone who hasn't gotten the chickenpox vaccine. How is this prevented? Vaccines are the best way to prevent you from getting chickenpox or shingles. Talk with your provider about getting these shots. Where to find more information Centers for Disease Control and Prevention (CDC): tonerpromos.no Contact a health care provider if: Your pain doesn't get better with medicine. Your pain doesn't get better after the rash heals. You have any signs of infection around the rash. Your rash or blisters get worse. You have a fever or chills. Get help right away if: The rash is on your face or nose. You have pain in your face or by your eye. You lose feeling on one side of your face. You have trouble seeing. You have ear pain or ringing in your ear. This information is not intended to replace  advice given to you by your health care provider. Make sure you discuss any questions you have with your health care provider. Document Revised: 01/20/2023 Document Reviewed: 06/04/2022 Elsevier Patient Education  2024 Elsevier [employer]   If you have been instructed to have an in-person evaluation today at a local Urgent Care facility, please use the link below. It will take you to a list of all of our available [facility], including address, phone number and hours of operation. Please do not delay care.  [facility]  If you or a family member do not have a primary care provider, use the link below to schedule a visit and establish care. When you choose a [facility] primary care physician or advanced practice provider, you gain a long-term partner in health. Find a Primary Care Provider  Learn more about [facility] in-office and virtual care options: [facility] - Get Care Now

## 2024-05-17 ENCOUNTER — Other Ambulatory Visit: Payer: Self-pay

## 2024-05-28 ENCOUNTER — Other Ambulatory Visit: Payer: Self-pay

## 2024-05-31 ENCOUNTER — Encounter: Payer: Self-pay | Admitting: Family

## 2024-06-03 ENCOUNTER — Telehealth: Admitting: Family

## 2024-06-03 DIAGNOSIS — B029 Zoster without complications: Secondary | ICD-10-CM | POA: Diagnosis not present

## 2024-06-03 MED ORDER — VALACYCLOVIR HCL 1 G PO TABS: 1000.0000 mg | ORAL_TABLET | Freq: Three times a day (TID) | ORAL | 0 refills | Status: AC

## 2024-06-03 MED ORDER — GABAPENTIN 100 MG PO CAPS: 100.0000 mg | ORAL_CAPSULE | Freq: Two times a day (BID) | ORAL | 0 refills | Status: AC

## 2024-06-06 ENCOUNTER — Encounter: Payer: Self-pay | Admitting: Family

## 2024-06-06 ENCOUNTER — Other Ambulatory Visit

## 2024-06-06 ENCOUNTER — Other Ambulatory Visit: Payer: Self-pay | Admitting: Family

## 2024-06-06 ENCOUNTER — Other Ambulatory Visit: Payer: Self-pay

## 2024-06-06 ENCOUNTER — Ambulatory Visit: Admitting: Family

## 2024-06-06 ENCOUNTER — Telehealth: Payer: Self-pay | Admitting: Family

## 2024-06-06 DIAGNOSIS — N951 Menopausal and female climacteric states: Secondary | ICD-10-CM

## 2024-06-06 DIAGNOSIS — E559 Vitamin D deficiency, unspecified: Secondary | ICD-10-CM | POA: Diagnosis not present

## 2024-06-06 LAB — COMPREHENSIVE METABOLIC PANEL WITH GFR
ALT: 60 U/L — ABNORMAL HIGH (ref 3–35)
AST: 36 U/L (ref 5–37)
Albumin: 4.6 g/dL (ref 3.5–5.2)
Alkaline Phosphatase: 57 U/L (ref 39–117)
BUN: 13 mg/dL (ref 6–23)
CO2: 30 meq/L (ref 19–32)
Calcium: 9.3 mg/dL (ref 8.4–10.5)
Chloride: 100 meq/L (ref 96–112)
Creatinine, Ser: 0.68 mg/dL (ref 0.40–1.20)
GFR: 105.9 mL/min
Glucose, Bld: 95 mg/dL (ref 70–99)
Potassium: 4.5 meq/L (ref 3.5–5.1)
Sodium: 139 meq/L (ref 135–145)
Total Bilirubin: 0.8 mg/dL (ref 0.2–1.2)
Total Protein: 6.9 g/dL (ref 6.0–8.3)

## 2024-06-06 LAB — CBC WITH DIFFERENTIAL/PLATELET
Basophils Absolute: 0.1 10*3/uL (ref 0.0–0.1)
Basophils Relative: 1 % (ref 0.0–3.0)
Eosinophils Absolute: 0.2 10*3/uL (ref 0.0–0.7)
Eosinophils Relative: 3.4 % (ref 0.0–5.0)
HCT: 40.7 % (ref 36.0–46.0)
Hemoglobin: 13.9 g/dL (ref 12.0–15.0)
Lymphocytes Relative: 26.9 % (ref 12.0–46.0)
Lymphs Abs: 1.8 10*3/uL (ref 0.7–4.0)
MCHC: 34.1 g/dL (ref 30.0–36.0)
MCV: 87.1 fl (ref 78.0–100.0)
Monocytes Absolute: 0.6 10*3/uL (ref 0.1–1.0)
Monocytes Relative: 9.5 % (ref 3.0–12.0)
Neutro Abs: 3.9 10*3/uL (ref 1.4–7.7)
Neutrophils Relative %: 59.2 % (ref 43.0–77.0)
Platelets: 264 10*3/uL (ref 150.0–400.0)
RBC: 4.67 Mil/uL (ref 3.87–5.11)
RDW: 13.4 % (ref 11.5–15.5)
WBC: 6.6 10*3/uL (ref 4.0–10.5)

## 2024-06-06 LAB — TSH: TSH: 1.9 u[IU]/mL (ref 0.35–5.50)

## 2024-06-06 LAB — FOLLICLE STIMULATING HORMONE: FSH: 34.4 m[IU]/mL

## 2024-06-06 LAB — VITAMIN D 25 HYDROXY (VIT D DEFICIENCY, FRACTURES): VITD: 19.14 ng/mL — ABNORMAL LOW (ref 30.00–100.00)

## 2024-06-06 MED ORDER — PAROXETINE HCL 10 MG PO TABS
10.0000 mg | ORAL_TABLET | Freq: Every day | ORAL | 3 refills | Status: DC
  Filled 2024-06-06: qty 90, 90d supply, fill #0

## 2024-06-06 MED ORDER — PAROXETINE HCL 10 MG PO TABS: 10.0000 mg | ORAL_TABLET | Freq: Every day | ORAL | 3 refills | Status: AC

## 2024-06-06 NOTE — Telephone Encounter (Signed)
 NOTED

## 2024-06-06 NOTE — Telephone Encounter (Signed)
 Kara Vaughn team  Labs ordered Pt is coming at 930 today 06/06/24 to have them drawn during her appointment time ( canceled due to provider schedule) ; please add to the schedule   called patient to let her know that appointment will need to be moved.  We had a discussion about current stress that she has been having as a relates to work, and a single income.  She is concerned with perimenopause.  She describes this herself as feeling more irritable.  She is interested in having baseline labs obtained.    We discussed starting Paxil  for mood instability.  She will schedule follow-up me in a couple weeks.  Labs may be obtained today 06/06/2024.

## 2024-06-06 NOTE — Telephone Encounter (Signed)
 Contacted pt she agreed to come in early to get her labs done. VV 06/22/2024 at 10am

## 2024-06-22 ENCOUNTER — Ambulatory Visit: Admitting: Family
